# Patient Record
Sex: Female | Born: 1995 | Race: Black or African American | Hispanic: No | Marital: Single | Smoking: Never smoker
Health system: Southern US, Community
[De-identification: ages and names within clinical notes are randomized; demographics above are authoritative.]

## PROBLEM LIST (undated history)

## (undated) ENCOUNTER — Inpatient Hospital Stay (HOSPITAL_COMMUNITY): Payer: Self-pay

## (undated) DIAGNOSIS — N760 Acute vaginitis: Secondary | ICD-10-CM

## (undated) DIAGNOSIS — K219 Gastro-esophageal reflux disease without esophagitis: Secondary | ICD-10-CM

## (undated) DIAGNOSIS — B9689 Other specified bacterial agents as the cause of diseases classified elsewhere: Secondary | ICD-10-CM

## (undated) DIAGNOSIS — N390 Urinary tract infection, site not specified: Secondary | ICD-10-CM

## (undated) HISTORY — PX: NO PAST SURGERIES: SHX2092

---

## 2015-03-02 ENCOUNTER — Ambulatory Visit (INDEPENDENT_AMBULATORY_CARE_PROVIDER_SITE_OTHER): Payer: Managed Care, Other (non HMO) | Admitting: Family Medicine

## 2015-03-02 VITALS — BP 90/64 | HR 83 | Temp 98.3°F | Resp 16 | Ht 63.0 in | Wt 104.0 lb

## 2015-03-02 DIAGNOSIS — N760 Acute vaginitis: Secondary | ICD-10-CM

## 2015-03-02 DIAGNOSIS — B3731 Acute candidiasis of vulva and vagina: Secondary | ICD-10-CM

## 2015-03-02 DIAGNOSIS — B373 Candidiasis of vulva and vagina: Secondary | ICD-10-CM | POA: Diagnosis not present

## 2015-03-02 DIAGNOSIS — A499 Bacterial infection, unspecified: Secondary | ICD-10-CM | POA: Diagnosis not present

## 2015-03-02 DIAGNOSIS — N898 Other specified noninflammatory disorders of vagina: Secondary | ICD-10-CM | POA: Diagnosis not present

## 2015-03-02 DIAGNOSIS — B9689 Other specified bacterial agents as the cause of diseases classified elsewhere: Secondary | ICD-10-CM

## 2015-03-02 LAB — POCT WET PREP WITH KOH
KOH Prep POC: POSITIVE
Trichomonas, UA: NEGATIVE

## 2015-03-02 MED ORDER — FLUCONAZOLE 150 MG PO TABS: 150.0000 mg | ORAL_TABLET | Freq: Once | ORAL | Status: DC

## 2015-03-02 MED ORDER — METRONIDAZOLE 500 MG PO TABS: 500.0000 mg | ORAL_TABLET | Freq: Three times a day (TID) | ORAL | Status: DC

## 2015-03-02 NOTE — Progress Notes (Signed)
Vaginitis Subjective:  Patient ID: Laurie Castro, female    DOB: 1995-08-24  Age: 19 y.o. MRN: [id number]  Patient is here with history of a vaginal discharge and odor and irritation. She does not have any pain. She has been sexually involved in the past, but it is been a couple of months. She was STD testing a month ago and she had BV a month ago. She does not feel the need for repeat STD testing since she has not had sex since then. She was treated with a cream the last time.   Objective:   No acute distress. Normal external genitalia. Creamy discharge is visible at the opening. Vaginal mucosa unremarkable except for thick creamy discharge. Wet prep taken. Bimanual exam feels no adnexal or uterine masses.  Assessment & Plan:   Assessment:  Vaginal discharge Vaginitis/vaginosis  Plan:  Wet prep. STI testing not done today  Results for orders placed or performed in visit on 03/02/15  POCT Wet Prep with KOH  Result Value Ref Range   Trichomonas, UA Negative    Clue Cells Wet Prep HPF POC Moderate    Epithelial Wet Prep HPF POC Many Few, Moderate, Many   Yeast Wet Prep HPF POC Positve    Bacteria Wet Prep HPF POC Many (A) None, Few   RBC Wet Prep HPF POC Moderate    WBC Wet Prep HPF POC Many    KOH Prep POC Positive      There are no Patient Instructions on file for this visit.   Mosella Kasa, MD 03/02/2015

## 2015-03-02 NOTE — Patient Instructions (Signed)
Take fluconazole one pill initially today and repeat in 3 or 4 days if still having any significant discharge or itching or irritation  Take the metronidazole one pill twice daily for bacterial vaginosis. Do not drink alcohol while on this.  Return if further problems or concerns  Vaginitis Vaginitis is an inflammation of the vagina. It is most often caused by a change in the normal balance of the bacteria and yeast that live in the vagina. This change in balance causes an overgrowth of certain bacteria or yeast, which causes the inflammation. There are different types of vaginitis, but the most common types are:  Bacterial vaginosis.  Yeast infection (candidiasis).  Trichomoniasis vaginitis. This is a sexually transmitted infection (STI).  Viral vaginitis.  Atropic vaginitis.  Allergic vaginitis. CAUSES  The cause depends on the type of vaginitis. Vaginitis can be caused by:  Bacteria (bacterial vaginosis).  Yeast (yeast infection).  A parasite (trichomoniasis vaginitis)  A virus (viral vaginitis).  Low hormone levels (atrophic vaginitis). Low hormone levels can occur during pregnancy, breastfeeding, or after menopause.  Irritants, such as bubble baths, scented tampons, and feminine sprays (allergic vaginitis). Other factors can change the normal balance of the yeast and bacteria that live in the vagina. These include:  Antibiotic medicines.  Poor hygiene.  Diaphragms, vaginal sponges, spermicides, birth control pills, and intrauterine devices (IUD).  Sexual intercourse.  Infection.  Uncontrolled diabetes.  A weakened immune system. SYMPTOMS  Symptoms can vary depending on the cause of the vaginitis. Common symptoms include:  Abnormal vaginal discharge.  The discharge is white, gray, or yellow with bacterial vaginosis.  The discharge is thick, white, and cheesy with a yeast infection.  The discharge is frothy and yellow or greenish with trichomoniasis.  A  bad vaginal odor.  The odor is fishy with bacterial vaginosis.  Vaginal itching, pain, or swelling.  Painful intercourse.  Pain or burning when urinating. Sometimes, there are no symptoms. TREATMENT  Treatment will vary depending on the type of infection.   Bacterial vaginosis and trichomoniasis are often treated with antibiotic creams or pills.  Yeast infections are often treated with antifungal medicines, such as vaginal creams or suppositories.  Viral vaginitis has no cure, but symptoms can be treated with medicines that relieve discomfort. Your sexual partner should be treated as well.  Atrophic vaginitis may be treated with an estrogen cream, pill, suppository, or vaginal ring. If vaginal dryness occurs, lubricants and moisturizing creams may help. You may be told to avoid scented soaps, sprays, or douches.  Allergic vaginitis treatment involves quitting the use of the product that is causing the problem. Vaginal creams can be used to treat the symptoms. HOME CARE INSTRUCTIONS   Take all medicines as directed by your caregiver.  Keep your genital area clean and dry. Avoid soap and only rinse the area with water.  Avoid douching. It can remove the healthy bacteria in the vagina.  Do not use tampons or have sexual intercourse until your vaginitis has been treated. Use sanitary pads while you have vaginitis.  Wipe from front to back. This avoids the spread of bacteria from the rectum to the vagina.  Let air reach your genital area.  Wear cotton underwear to decrease moisture buildup.  Avoid wearing underwear while you sleep until your vaginitis is gone.  Avoid tight pants and underwear or nylons without a cotton panel.  Take off wet clothing (especially bathing suits) as soon as possible.  Use mild, non-scented products. Avoid using irritants, such  as:  Scented feminine sprays.  Fabric softeners.  Scented detergents.  Scented tampons.  Scented soaps or bubble  baths.  Practice safe sex and use condoms. Condoms may prevent the spread of trichomoniasis and viral vaginitis. SEEK MEDICAL CARE IF:   You have abdominal pain.  You have a fever or persistent symptoms for more than 2-3 days.  You have a fever and your symptoms suddenly get worse. Document Released: 03/31/2007 Document Revised: 02/26/2012 Document Reviewed: 11/14/2011 [facility] Patient Information 2015 [place], [place]. This information is not intended to replace advice given to you by your health care provider. Make sure you discuss any questions you have with your health care provider.  Bacterial Vaginosis Bacterial vaginosis is a vaginal infection that occurs when the normal balance of bacteria in the vagina is disrupted. It results from an overgrowth of certain bacteria. This is the most common vaginal infection in women of childbearing age. Treatment is important to prevent complications, especially in pregnant women, as it can cause a premature delivery. CAUSES  Bacterial vaginosis is caused by an increase in harmful bacteria that are normally present in smaller amounts in the vagina. Several different kinds of bacteria can cause bacterial vaginosis. However, the reason that the condition develops is not fully understood. RISK FACTORS Certain activities or behaviors can put you at an increased risk of developing bacterial vaginosis, including:  Having a new sex partner or multiple sex partners.  Douching.  Using an intrauterine device (IUD) for contraception. Women do not get bacterial vaginosis from toilet seats, bedding, swimming pools, or contact with objects around them. SIGNS AND SYMPTOMS  Some women with bacterial vaginosis have no signs or symptoms. Common symptoms include:  Grey vaginal discharge.  A fishlike odor with discharge, especially after sexual intercourse.  Itching or burning of the vagina and vulva.  Burning or pain with urination. DIAGNOSIS  Your health  care provider will take a medical history and examine the vagina for signs of bacterial vaginosis. A sample of vaginal fluid may be taken. Your health care provider will look at this sample under a microscope to check for bacteria and abnormal cells. A vaginal pH test may also be done.  TREATMENT  Bacterial vaginosis may be treated with antibiotic medicines. These may be given in the form of a pill or a vaginal cream. A second round of antibiotics may be prescribed if the condition comes back after treatment.  HOME CARE INSTRUCTIONS   Only take over-the-counter or prescription medicines as directed by your health care provider.  If antibiotic medicine was prescribed, take it as directed. Make sure you finish it even if you start to feel better.  Do not have sex until treatment is completed.  Tell all sexual partners that you have a vaginal infection. They should see their health care provider and be treated if they have problems, such as a mild rash or itching.  Practice safe sex by using condoms and only having one sex partner. SEEK MEDICAL CARE IF:   Your symptoms are not improving after 3 days of treatment.  You have increased discharge or pain.  You have a fever. MAKE SURE YOU:   Understand these instructions.  Will watch your condition.  Will get help right away if you are not doing well or get worse. FOR MORE INFORMATION  Centers for Disease Control and Prevention, Division of STD Prevention: [employer] American Sexual Health Association (ASHA): www.ashastd.org  Document Released: 06/03/2005 Document Revised: 03/24/2013 Document Reviewed: 01/13/2013 ExitCare Patient Information  2015 [employer]. This information is not intended to replace advice given to you by your health care provider. Make sure you discuss any questions you have with your health care provider.

## 2015-07-08 ENCOUNTER — Ambulatory Visit (INDEPENDENT_AMBULATORY_CARE_PROVIDER_SITE_OTHER): Payer: Managed Care, Other (non HMO) | Admitting: Physician Assistant

## 2015-07-08 VITALS — BP 110/70 | HR 74 | Temp 98.2°F | Resp 18 | Ht 61.25 in | Wt 110.2 lb

## 2015-07-08 DIAGNOSIS — B373 Candidiasis of vulva and vagina: Secondary | ICD-10-CM

## 2015-07-08 DIAGNOSIS — N898 Other specified noninflammatory disorders of vagina: Secondary | ICD-10-CM | POA: Diagnosis not present

## 2015-07-08 DIAGNOSIS — B3731 Acute candidiasis of vulva and vagina: Secondary | ICD-10-CM

## 2015-07-08 DIAGNOSIS — L309 Dermatitis, unspecified: Secondary | ICD-10-CM | POA: Diagnosis not present

## 2015-07-08 LAB — POCT WET + KOH PREP: TRICH BY WET PREP: ABSENT

## 2015-07-08 LAB — POCT URINE PREGNANCY: PREG TEST UR: NEGATIVE

## 2015-07-08 MED ORDER — FLUCONAZOLE 150 MG PO TABS: 150.0000 mg | ORAL_TABLET | Freq: Once | ORAL | Status: DC

## 2015-07-08 MED ORDER — BETAMETHASONE DIPROPIONATE AUG 0.05 % EX LOTN: TOPICAL_LOTION | Freq: Two times a day (BID) | CUTANEOUS | Status: DC

## 2015-07-08 NOTE — Patient Instructions (Signed)
I will contact you with your lab results as soon as they are available.   If you have not heard from me in 2 weeks, please contact me.  The fastest way to get your results is to register for My Chart (see the instructions on the last page of this printout).   

## 2015-07-08 NOTE — Progress Notes (Signed)
   Laurie Castro  MRN: [id number] DOB: 03/28/1996  Subjective:  Pt presents to clinic with 2 concerns 1- rash on her right hand - she has h/o eczema - the rash has been there for a while and it seems to come and go 2- vaginal discharge for the last 2 days - it is yellow/green color - no itching or irritation - no urinary symptoms.  She is sexually active - with a new partner about 2 weeks - has used condoms sometimes.  Patient Active Problem List   Diagnosis Date Noted  . Eczema 07/08/2015    Current Outpatient Prescriptions on File Prior to Visit  Medication Sig Dispense Refill  . Norgestimate-Ethinyl Estradiol Triphasic 0.18/0.215/0.25 MG-35 MCG tablet Take 1 tablet by mouth daily.     No current facility-administered medications on file prior to visit.    No Known Allergies  Review of Systems  Constitutional: Negative for fever and chills.  Genitourinary: Positive for vaginal discharge. Negative for dysuria, urgency and frequency.   Objective:  BP 110/70 mmHg  Pulse 74  Temp(Src) 98.2 F (36.8 C) (Oral)  Resp 18  Ht 5' 1.25" (1.556 m)  Wt 110 lb 4 oz (50.009 kg)  BMI 20.66 kg/m2  SpO2 97%  LMP 06/05/2015 (Exact Date)  Physical Exam  Constitutional: She is oriented to person, place, and time and well-developed, well-nourished, and in no distress.  HENT:  Head: Normocephalic and atraumatic.  Right Ear: Hearing and external ear normal.  Left Ear: Hearing and external ear normal.  Eyes: Conjunctivae are normal.  Neck: Normal range of motion.  Pulmonary/Chest: Effort normal.  Neurological: She is alert and oriented to person, place, and time. Gait normal.  Skin: Skin is warm and dry.  2in patch of dry skin on right dorsum of hand without erythema or central clearing  Psychiatric: Mood, memory, affect and judgment normal.  Vitals reviewed.  Results for orders placed or performed in visit on 07/08/15  POCT Wet + KOH Prep  Result Value Ref Range   Yeast by KOH  Present Present, Absent   Yeast by wet prep Present Present, Absent   WBC by wet prep Moderate (A) None, Few, Too numerous to count   Clue Cells Wet Prep HPF POC None None, Too numerous to count   Trich by wet prep Absent Present, Absent   Bacteria Wet Prep HPF POC Many (A) None, Few, Too numerous to count   Epithelial Cells By [employer] ([facility]) Many (A) None, Few, Too numerous to count   RBC,UR,HPF,POC None None RBC/hpf  POCT urine pregnancy  Result Value Ref Range   Preg Test, Ur Negative Negative    Assessment and Plan :  Eczema - Plan: betamethasone, augmented, (DIPROLENE) 0.05 % lotion  Vaginal discharge - Plan: POCT Wet + KOH Prep, POCT urine pregnancy, GC/Chlamydia Probe Amp - pt started her OCP in mid cycle so her LMP was 12/16 but she just started on her placebo week - she feels like she is getting ready to start her menses - she will f/u if she does not start - we will wait for uriprobe to return for treatment if indicated  Yeast vaginitis - Plan: fluconazole (DIFLUCAN) 150 MG tablet, Care order/instruction  Benny Lennert PA-C  [facility] 07/08/2015 4:29 PM

## 2015-07-10 LAB — GC/CHLAMYDIA PROBE AMP
CT Probe RNA: NOT DETECTED
GC Probe RNA: NOT DETECTED

## 2016-08-07 ENCOUNTER — Encounter: Payer: Self-pay | Admitting: Family Medicine

## 2016-08-07 ENCOUNTER — Ambulatory Visit (INDEPENDENT_AMBULATORY_CARE_PROVIDER_SITE_OTHER): Payer: 59 | Admitting: Family Medicine

## 2016-08-07 VITALS — BP 105/65 | HR 93 | Temp 98.8°F | Resp 18 | Ht 61.25 in | Wt 104.4 lb

## 2016-08-07 DIAGNOSIS — Z23 Encounter for immunization: Secondary | ICD-10-CM | POA: Diagnosis not present

## 2016-08-07 DIAGNOSIS — Z113 Encounter for screening for infections with a predominantly sexual mode of transmission: Secondary | ICD-10-CM | POA: Diagnosis not present

## 2016-08-07 DIAGNOSIS — A499 Bacterial infection, unspecified: Secondary | ICD-10-CM | POA: Diagnosis not present

## 2016-08-07 DIAGNOSIS — R350 Frequency of micturition: Secondary | ICD-10-CM | POA: Diagnosis not present

## 2016-08-07 DIAGNOSIS — N898 Other specified noninflammatory disorders of vagina: Secondary | ICD-10-CM

## 2016-08-07 LAB — POCT URINALYSIS DIP (MANUAL ENTRY)
BILIRUBIN UA: NEGATIVE
BILIRUBIN UA: NEGATIVE
GLUCOSE UA: NEGATIVE
Leukocytes, UA: NEGATIVE
Nitrite, UA: NEGATIVE
PH UA: 7.5
Protein Ur, POC: NEGATIVE
Spec Grav, UA: 1.015
Urobilinogen, UA: 0.2

## 2016-08-07 MED ORDER — METRONIDAZOLE 0.75 % VA GEL: 1.0000 | Freq: Two times a day (BID) | VAGINAL | 1 refills | Status: DC

## 2016-08-07 MED ORDER — BORIC ACID TOPICAL POWD: 0 refills | Status: DC

## 2016-08-07 NOTE — Patient Instructions (Addendum)
After completing your next period bleed start the Metrogel vaginal suppository twice a day for one week.   Then use boric acid. Boric acid-A course of boric acid insert per vagina daily for one week then twice a week for a second week After you finish boric acid do another course of metrogel after your next period.  Return to clinic in 6 weeks   IF you received an x-ray today, you will receive an invoice from [facility]. Please contact [facility] at [phone] with questions or concerns regarding your invoice.   IF you received labwork today, you will receive an invoice from [facility]. Please contact [facility] at [phone] with questions or concerns regarding your invoice.   Our billing staff will not be able to assist you with questions regarding bills from these companies.  You will be contacted with the lab results as soon as they are available. The fastest way to get your results is to activate your My Chart account. Instructions are located on the last page of this paperwork. If you have not heard from us regarding the results in 2 weeks, please contact this office.

## 2016-08-07 NOTE — Progress Notes (Signed)
Chief Complaint  Patient presents with  . Follow-up    bladder infection.     HPI   Pt reports that she has been getting frequent UTIs She went to the hospital and was told that she had a bladder infection Since being treated for cystitis she has been having BV back to back  She reports that she typically gets vaginitis due to BV every month around her period. She reports that sometimes she gets the UTI instead of BV or vice versa  She has also been treated for yeast  She reports that her period started early this morning  For the past two days she has a fishy odor and discharge Patient's last menstrual period was 08/06/2016.  She is sexually active and her last intercourse was 4 days ago.   History reviewed. No pertinent past medical history.  Current Outpatient Prescriptions  Medication Sig Dispense Refill  . betamethasone, augmented, (DIPROLENE) 0.05 % lotion Apply topically 2 (two) times daily. 30 mL 0  . Boric Acid Topical POWD Insert 600mg  capsule suppository per vagina daily for one week followed by twice a week until the next period. 1 Bottle 0  . fluconazole (DIFLUCAN) 150 MG tablet Take 1 tablet (150 mg total) by mouth once. Repeat in 1 week is needed (Patient not taking: Reported on 08/07/2016) 2 tablet 0  . metroNIDAZOLE (METROGEL VAGINAL) 0.75 % vaginal gel Place 1 Applicatorful vaginally 2 (two) times daily. 70 g 1  . Norgestimate-Ethinyl Estradiol Triphasic 0.18/0.215/0.25 MG-35 MCG tablet Take 1 tablet by mouth daily.     No current facility-administered medications for this visit.     Allergies: No Known Allergies  History reviewed. No pertinent surgical history.  Social History   Social History  . Marital status: Single    Spouse name: N/A  . Number of children: N/A  . Years of education: N/A   Social History Main Topics  . Smoking status: Never Smoker  . Smokeless tobacco: Never Used  . Alcohol use None  . Drug use: Unknown  . Sexual activity:  Not Asked   Other Topics Concern  . None   Social History Narrative  . None    ROS  Objective: Vitals:   08/07/16 1618  BP: 105/65  Pulse: 93  Resp: 18  Temp: 98.8 F (37.1 C)  TempSrc: Oral  SpO2: 100%  Weight: 104 lb 6.4 oz (47.4 kg)  Height: 5' 1.25" (1.556 m)    Physical Exam  Constitutional: She is oriented to person, place, and time. She appears well-developed and well-nourished.  HENT:  Head: Normocephalic and atraumatic.  Cardiovascular: Normal rate, regular rhythm and normal heart sounds.   Pulmonary/Chest: Effort normal and breath sounds normal. No respiratory distress. She has no wheezes.  Abdominal: Soft. Bowel sounds are normal.  No flank pain or suprapubic tenderness  Neurological: She is alert and oriented to person, place, and time.    Assessment and Plan Dre Laurie Castro was seen today for follow-up.  Diagnoses and all orders for this visit:  Need for prophylactic vaccination and inoculation against influenza -     Flu Vaccine QUAD 36+ mos IM  Urinary frequency- no uti -     POCT urinalysis dipstick  Vaginal discharge- disucssed that if she continues to have vaginal discharge with fishy odor to use boric acid after completing the metrogel Screened for stds today -     GC/Chlamydia Probe Amp -     HIV antibody -     Boric  Acid Topical POWD; Insert 600mg  capsule suppository per vagina daily for one week followed by twice a week until the next period. -     metroNIDAZOLE (METROGEL VAGINAL) 0.75 % vaginal gel; Place 1 Applicatorful vaginally 2 (two) times daily.  Recurrent bacterial infection -     HIV antibody  Screen for STD (sexually transmitted disease) -     HIV antibody  Other orders      Laurie Castro A Laurie Castro

## 2016-08-08 LAB — GC/CHLAMYDIA PROBE AMP
CHLAMYDIA, DNA PROBE: NEGATIVE
Neisseria gonorrhoeae by PCR: NEGATIVE

## 2016-08-08 LAB — HIV ANTIBODY (ROUTINE TESTING W REFLEX): HIV SCREEN 4TH GENERATION: NONREACTIVE

## 2016-08-16 ENCOUNTER — Telehealth: Payer: Self-pay | Admitting: Family Medicine

## 2016-08-16 NOTE — Telephone Encounter (Signed)
I called patient back and she prefers to speak with you regarding her medication administration question.

## 2016-08-16 NOTE — Telephone Encounter (Signed)
Pt is needing to talk to someone about how take her medication   Best number [phone]

## 2016-08-16 NOTE — Telephone Encounter (Signed)
Notify the patient that I am out of the office [facility]

## 2016-08-16 NOTE — Telephone Encounter (Signed)
Pt is returning call from [facility], but would like to talk to Dr Creta LevinStallings if possible. Will keep her phone on her.

## 2016-08-16 NOTE — Telephone Encounter (Signed)
Left voice mail

## 2016-08-17 NOTE — Telephone Encounter (Signed)
Pt wanted to clarify metrogel and boric acid directions. I read them from dr Creta Levinstallings plan Verbalized understanding

## 2016-09-03 ENCOUNTER — Telehealth: Payer: Self-pay | Admitting: Family Medicine

## 2016-09-03 NOTE — Telephone Encounter (Signed)
PATIENT CALLED TO GET MORE CLARIFICATION ON TAKING THE BORIC ACID AND METROGEL DIRECTIONS FROM DR. STALLINGS. BEST PHONE [phone] (CELL) MBC

## 2016-09-04 NOTE — Telephone Encounter (Signed)
Used metrogel then boric acid  Still has sx's of BV and now a discharge is white and fishy  Is that ok?  Going to do next round after period.

## 2016-09-04 NOTE — Telephone Encounter (Signed)
After she completes the second round she should return if she still has odor.

## 2016-09-06 NOTE — Telephone Encounter (Signed)
Pt advised.

## 2016-10-04 ENCOUNTER — Ambulatory Visit: Payer: 59

## 2016-10-04 ENCOUNTER — Ambulatory Visit (INDEPENDENT_AMBULATORY_CARE_PROVIDER_SITE_OTHER): Payer: 59 | Admitting: Family Medicine

## 2016-10-04 VITALS — BP 110/74 | HR 82 | Temp 98.7°F | Resp 18 | Ht 61.25 in | Wt 106.4 lb

## 2016-10-04 DIAGNOSIS — N898 Other specified noninflammatory disorders of vagina: Secondary | ICD-10-CM

## 2016-10-04 DIAGNOSIS — B3731 Acute candidiasis of vulva and vagina: Secondary | ICD-10-CM

## 2016-10-04 DIAGNOSIS — B373 Candidiasis of vulva and vagina: Secondary | ICD-10-CM | POA: Diagnosis not present

## 2016-10-04 MED ORDER — METRONIDAZOLE 500 MG PO TABS: 500.0000 mg | ORAL_TABLET | Freq: Two times a day (BID) | ORAL | 0 refills | Status: AC

## 2016-10-04 MED ORDER — FLUCONAZOLE 150 MG PO TABS: 150.0000 mg | ORAL_TABLET | Freq: Once | ORAL | 0 refills | Status: AC

## 2016-10-04 MED ORDER — ONDANSETRON HCL 4 MG PO TABS: 4.0000 mg | ORAL_TABLET | Freq: Three times a day (TID) | ORAL | 0 refills | Status: DC | PRN

## 2016-10-04 NOTE — Progress Notes (Signed)
Chief Complaint  Patient presents with  . Follow-up    BV and pt states it has returned     HPI Pt reports that she has a thick white discharge She is concerned that the BV has returned Patient's last menstrual period was 10/04/2016. She reports that she is not always using condoms She is consistently sexually active with the same partner She does not use probiotics States that her vaginal discharge started after having recurrent UTIs Since hre last visit 08/07/2016 she has done flagyl followed by boric acid, followed by flagyl She denies dysparunea She is currently menstruating but has note a fishy odor  No past medical history on file.  Current Outpatient Prescriptions  Medication Sig Dispense Refill  . betamethasone, augmented, (DIPROLENE) 0.05 % lotion Apply topically 2 (two) times daily. (Patient not taking: Reported on 10/04/2016) 30 mL 0  . Boric Acid Topical POWD Insert  capsule suppository per vagina daily for one week followed by twice a week until the next period. (Patient not taking: Reported on 10/04/2016) 1 Bottle 0  . metroNIDAZOLE (FLAGYL) 500 MG tablet Take 1 tablet (500 mg total) by mouth 2 (two) times daily. 21 tablet 0  . metroNIDAZOLE (METROGEL VAGINAL) 0.75 % vaginal gel Place 1 Applicatorful vaginally 2 (two) times daily. (Patient not taking: Reported on 10/04/2016) 70 g 1  . Norgestimate-Ethinyl Estradiol Triphasic 0.18/0.215/0.25 MG-35 MCG tablet Take 1 tablet by mouth daily.    . ondansetron (ZOFRAN) 4 MG tablet Take 1 tablet (4 mg total) by mouth every 8 (eight) hours as needed for nausea or vomiting. 20 tablet 0   No current facility-administered medications for this visit.     Allergies: No Known Allergies  No past surgical history on file.  Social History   Social History  . Marital status: Single    Spouse name: N/A  . Number of children: N/A  . Years of education: N/A   Social History Main Topics  . Smoking status: Never Smoker  .  Smokeless tobacco: Never Used  . Alcohol use None  . Drug use: Unknown  . Sexual activity: Not Asked   Other Topics Concern  . None   Social History Narrative  . None    Review of Systems  Constitutional: Negative for chills and fever.  Respiratory: Negative for cough and wheezing.   Cardiovascular: Negative for chest pain and palpitations.  Gastrointestinal: Negative for abdominal pain, nausea and vomiting.  Genitourinary: Negative for dysuria, frequency and urgency.    Objective: Vitals:   10/04/16 1719  BP: 110/74  Pulse: 82  Resp: 18  Temp: 98.7 F (37.1 C)  TempSrc: Oral  SpO2: 98%  Weight: 106 lb 6.4 oz (48.3 kg)  Height: 5' 1.25" (1.556 m)    Physical Exam  Chaperone present Vaginal exam Labia normal bilaterally without skin lesions Urethral meatus normal appearing without erythema Vagina with blood noted, unable to visualize discharge discharge No CMT, ovaries small and not palpable Uterus midline, nontender  Assessment and Plan Dre'Yon was seen today for follow-up.  Diagnoses and all orders for this visit:  Vaginal discharge- cancelled wet prep since pt is bleeding In the past pt was on metronidazole gel but will do pills this time and plan to treat for yeast after with nausea meds -  Advised probiotic -     Cancel: POCT Wet + KOH Prep  Yeast vaginitis- empiric treatment advised -     fluconazole (DIFLUCAN) 150 MG tablet; Take 1 tablet (150 mg total)  by mouth once. Repeat in 3 days  Other orders -     metroNIDAZOLE (FLAGYL) 500 MG tablet; Take 1 tablet (500 mg total) by mouth 2 (two) times daily. -     ondansetron (ZOFRAN) 4 MG tablet; Take 1 tablet (4 mg total) by mouth every 8 (eight) hours as needed for nausea or vomiting.     Lynnox Girten A Marquin Patino

## 2016-10-04 NOTE — Patient Instructions (Addendum)
Align probiotic after you finish the antibiotics    IF you received an x-ray today, you will receive an invoice from [facility]. Please contact [facility] at [phone] with questions or concerns regarding your invoice.   IF you received labwork today, you will receive an invoice from [facility]. Please contact [facility] at [phone] with questions or concerns regarding your invoice.   Our billing staff will not be able to assist you with questions regarding bills from these companies.  You will be contacted with the lab results as soon as they are available. The fastest way to get your results is to activate your My Chart account. Instructions are located on the last page of this paperwork. If you have not heard from [place] regarding the results in 2 weeks, please contact this office.

## 2016-11-01 ENCOUNTER — Telehealth: Payer: Self-pay | Admitting: Family Medicine

## 2016-11-01 NOTE — Telephone Encounter (Signed)
Pt is needing to talk with dr Creta Levinstallings about the bv symptoms that are still occurring right before her period  Best number [phone]

## 2016-11-02 NOTE — Telephone Encounter (Signed)
BV symptoms come every time before period   Was treated with pill and boric acid.  Wants to find a better treatment /plan  She is short money and cant afford a office visit

## 2016-11-05 ENCOUNTER — Ambulatory Visit (INDEPENDENT_AMBULATORY_CARE_PROVIDER_SITE_OTHER): Payer: 59 | Admitting: Family Medicine

## 2016-11-05 ENCOUNTER — Encounter: Payer: Self-pay | Admitting: Family Medicine

## 2016-11-05 VITALS — BP 99/64 | HR 77 | Temp 98.3°F | Resp 16 | Ht 61.5 in | Wt 101.3 lb

## 2016-11-05 DIAGNOSIS — N898 Other specified noninflammatory disorders of vagina: Secondary | ICD-10-CM

## 2016-11-05 LAB — POCT WET + KOH PREP
TRICH BY WET PREP: ABSENT
YEAST BY WET PREP: ABSENT
Yeast by KOH: ABSENT

## 2016-11-05 MED ORDER — METRONIDAZOLE 0.75 % VA GEL: 1.0000 | Freq: Two times a day (BID) | VAGINAL | 0 refills | Status: DC

## 2016-11-05 NOTE — Patient Instructions (Addendum)
   IF you received an x-ray today, you will receive an invoice from [facility]. Please contact [facility] at [phone] with questions or concerns regarding your invoice.   IF you received labwork today, you will receive an invoice from [facility]. Please contact [facility] at [phone] with questions or concerns regarding your invoice.   Our billing staff will not be able to assist you with questions regarding bills from these companies.  You will be contacted with the lab results as soon as they are available. The fastest way to get your results is to activate your My Chart account. Instructions are located on the last page of this paperwork. If you have not heard from us regarding the results in 2 weeks, please contact this office.     Bacterial Vaginosis Bacterial vaginosis is a vaginal infection that occurs when the normal balance of bacteria in the vagina is disrupted. It results from an overgrowth of certain bacteria. This is the most common vaginal infection among women ages 15-44. Because bacterial vaginosis increases your risk for STIs (sexually transmitted infections), getting treated can help reduce your risk for chlamydia, gonorrhea, herpes, and HIV (human immunodeficiency virus). Treatment is also important for preventing complications in pregnant women, because this condition can cause an early (premature) delivery. What are the causes? This condition is caused by an increase in harmful bacteria that are normally present in small amounts in the vagina. However, the reason that the condition develops is not fully understood. What increases the risk? The following factors may make you more likely to develop this condition:  Having a new sexual partner or multiple sexual partners.  Having unprotected sex.  Douching.  Having an intrauterine device (IUD).  Smoking.  Drug and alcohol abuse.  Taking certain antibiotic medicines.  Being  pregnant.  You cannot get bacterial vaginosis from toilet seats, bedding, swimming pools, or contact with objects around you. What are the signs or symptoms? Symptoms of this condition include:  Grey or white vaginal discharge. The discharge can also be watery or foamy.  A fish-like odor with discharge, especially after sexual intercourse or during menstruation.  Itching in and around the vagina.  Burning or pain with urination.  Some women with bacterial vaginosis have no signs or symptoms. How is this diagnosed? This condition is diagnosed based on:  Your medical history.  A physical exam of the vagina.  Testing a sample of vaginal fluid under a microscope to look for a large amount of bad bacteria or abnormal cells. Your health care provider may use a cotton swab or a small wooden spatula to collect the sample.  How is this treated? This condition is treated with antibiotics. These may be given as a pill, a vaginal cream, or a medicine that is put into the vagina (suppository). If the condition comes back after treatment, a second round of antibiotics may be needed. Follow these instructions at home: Medicines  Take over-the-counter and prescription medicines only as told by your health care provider.  Take or use your antibiotic as told by your health care provider. Do not stop taking or using the antibiotic even if you start to feel better. General instructions  If you have a female sexual partner, tell her that you have a vaginal infection. She should see her health care provider and be treated if she has symptoms. If you have a female sexual partner, he does not need treatment.  During treatment: ? Avoid sexual activity until you finish treatment. ?   Do not douche. ? Avoid alcohol as directed by your health care provider. ? Avoid breastfeeding as directed by your health care provider.  Drink enough water and fluids to keep your urine clear or pale yellow.  Keep the  area around your vagina and rectum clean. ? Wash the area daily with warm water. ? Wipe yourself from front to back after using the toilet.  Keep all follow-up visits as told by your health care provider. This is important. How is this prevented?  Do not douche.  Wash the outside of your vagina with warm water only.  Use protection when having sex. This includes latex condoms and dental dams.  Limit how many sexual partners you have. To help prevent bacterial vaginosis, it is best to have sex with just one partner (monogamous).  Make sure you and your sexual partner are tested for STIs.  Wear cotton or cotton-lined underwear.  Avoid wearing tight pants and pantyhose, especially during summer.  Limit the amount of alcohol that you drink.  Do not use any products that contain nicotine or tobacco, such as cigarettes and e-cigarettes. If you need help quitting, ask your health care provider.  Do not use illegal drugs. Where to find more information:  Centers for Disease Control and Prevention: www.cdc.gov/std  American Sexual Health Association (ASHA): www.ashastd.org  U.S. Department of Health and Human Services, Office on Women's Health: www.womenshealth.gov/ or https://www.womenshealth.gov/a-z-topics/bacterial-vaginosis Contact a health care provider if:  Your symptoms do not improve, even after treatment.  You have more discharge or pain when urinating.  You have a fever.  You have pain in your abdomen.  You have pain during sex.  You have vaginal bleeding between periods. Summary  Bacterial vaginosis is a vaginal infection that occurs when the normal balance of bacteria in the vagina is disrupted.  Because bacterial vaginosis increases your risk for STIs (sexually transmitted infections), getting treated can help reduce your risk for chlamydia, gonorrhea, herpes, and HIV (human immunodeficiency virus). Treatment is also important for preventing complications in  pregnant women, because the condition can cause an early (premature) delivery.  This condition is treated with antibiotic medicines. These may be given as a pill, a vaginal cream, or a medicine that is put into the vagina (suppository). This information is not intended to replace advice given to you by your health care provider. Make sure you discuss any questions you have with your health care provider. Document Released: 06/03/2005 Document Revised: 02/17/2016 Document Reviewed: 02/17/2016 Elsevier Interactive Patient Education  2017 Elsevier Inc.  

## 2016-11-05 NOTE — Progress Notes (Signed)
  Chief Complaint  Patient presents with  . Follow-up    HPI  Pt reports that she has another discharge right before her period She states that for the past few days her symptoms have decreased She was doing apple cider vinegar baths which seemed to help with her odor Patient's last menstrual period was 10/31/2016.  No past medical history on file.  Current Outpatient Prescriptions  Medication Sig Dispense Refill  . famotidine (PEPCID) 20 MG tablet Take 1 tablet (20 mg total) by mouth 2 (two) times daily. Before meals 60 tablet 1  . metroNIDAZOLE (METROGEL) 0.75 % vaginal gel After you complete the oral flagyl, then start the metrogel, 1 applicator 2 x a week at bedtime for 6 months. 70 g 8   No current facility-administered medications for this visit.     Allergies: No Known Allergies  No past surgical history on file.  Social History   Social History  . Marital status: Single    Spouse name: N/A  . Number of children: N/A  . Years of education: N/A   Social History Main Topics  . Smoking status: Never Smoker  . Smokeless tobacco: Never Used  . Alcohol use 0.6 oz/week    1 Standard drinks or equivalent per week  . Drug use: No  . Sexual activity: Yes    Partners: Male    Birth control/ protection: None   Other Topics Concern  . None   Social History Narrative  . None    ROS See hpi  Objective: Vitals:   11/05/16 1346  BP: 99/64  Pulse: 77  Resp: 16  Temp: 98.3 F (36.8 C)  TempSrc: Oral  SpO2: 100%  Weight: 101 lb 4.8 oz (45.9 kg)  Height: 5' 1.5" (1.562 m)    Physical Exam  Constitutional: She appears well-developed and well-nourished.   Vaginal exam Labia normal bilaterally without skin lesions Urethral meatus normal appearing without erythema Vagina with white thick discharge  Moderate amount of clue cells  No yeast   Assessment and Plan Laurie Castro was seen today for follow-up.  Diagnoses and all orders for this visit:  Vaginal  discharge- advised metrogel for BV -     POCT Wet + KOH Prep       Eulas Schweitzer A Ramonia Mcclaran

## 2016-11-06 NOTE — Telephone Encounter (Signed)
discussed that if the Metrogel does not work will use Clindamycin on the next episode. Continued use of probiotic and skin care precautions as well as condom use discussed.

## 2016-11-19 ENCOUNTER — Encounter: Payer: Self-pay | Admitting: Family Medicine

## 2016-11-20 MED ORDER — CLOTRIMAZOLE-BETAMETHASONE 1-0.05 % EX CREA: 1.0000 "application " | TOPICAL_CREAM | Freq: Two times a day (BID) | CUTANEOUS | 0 refills | Status: DC

## 2016-12-02 ENCOUNTER — Encounter: Payer: Self-pay | Admitting: Family Medicine

## 2016-12-03 ENCOUNTER — Encounter: Payer: Self-pay | Admitting: Family Medicine

## 2016-12-04 ENCOUNTER — Telehealth: Payer: Self-pay | Admitting: Family Medicine

## 2016-12-04 DIAGNOSIS — B9689 Other specified bacterial agents as the cause of diseases classified elsewhere: Secondary | ICD-10-CM

## 2016-12-04 DIAGNOSIS — N760 Acute vaginitis: Principal | ICD-10-CM

## 2016-12-04 NOTE — Telephone Encounter (Signed)
Pt is still having issues with BV.  She states that her and Creta LevinStallings had agreed if it is not getting any better, that pt did not need to come back for a follow up and that she will refer her to a gynecologist instead to avoid having to keep paying our copay.  Please advise [phone]

## 2016-12-05 ENCOUNTER — Ambulatory Visit: Payer: 59 | Admitting: Physician Assistant

## 2016-12-05 NOTE — Telephone Encounter (Signed)
Dr. Please advise.

## 2016-12-06 MED ORDER — METRONIDAZOLE 0.75 % VA GEL: 1.0000 | Freq: Two times a day (BID) | VAGINAL | 0 refills | Status: DC

## 2016-12-06 NOTE — Telephone Encounter (Signed)
Please notify the patient that I sent in some more of the metrogel and placed referral for Gynecology

## 2016-12-11 NOTE — Telephone Encounter (Signed)
Spoke with patient and advised that rx gel was sent into the pharmacy. Pt stated that they called her yesterday and she picked it up. Also advised that a referral was placed for gynecology.

## 2016-12-16 ENCOUNTER — Ambulatory Visit (INDEPENDENT_AMBULATORY_CARE_PROVIDER_SITE_OTHER): Payer: 59 | Admitting: Obstetrics and Gynecology

## 2016-12-16 ENCOUNTER — Encounter: Payer: Self-pay | Admitting: Obstetrics and Gynecology

## 2016-12-16 VITALS — BP 110/60 | HR 72 | Resp 14 | Ht 61.25 in | Wt 101.0 lb

## 2016-12-16 DIAGNOSIS — N760 Acute vaginitis: Secondary | ICD-10-CM | POA: Diagnosis not present

## 2016-12-16 DIAGNOSIS — B9689 Other specified bacterial agents as the cause of diseases classified elsewhere: Secondary | ICD-10-CM | POA: Diagnosis not present

## 2016-12-16 MED ORDER — METRONIDAZOLE 0.75 % VA GEL: VAGINAL | 8 refills | Status: DC

## 2016-12-16 MED ORDER — METRONIDAZOLE 500 MG PO TABS: 500.0000 mg | ORAL_TABLET | Freq: Two times a day (BID) | ORAL | 0 refills | Status: DC

## 2016-12-16 MED ORDER — PROMETHAZINE HCL 12.5 MG PO TABS: 12.5000 mg | ORAL_TABLET | Freq: Four times a day (QID) | ORAL | 0 refills | Status: DC | PRN

## 2016-12-16 NOTE — Progress Notes (Signed)
21 y.o. G1P0010 SingleAfrican AmericanF here referred by PCP, Dr Collie SiadZoe Castro for chronic BV.  The patient was treated for BV in 5/18. The patient reports at least 4-5 episodes of BV in the last 4-5 months. Her symptoms come cyclically, initially before then after her cycles. Currently not have symptoms.  Her symptoms are a fishy odor and an increase in a thick white/yellow d/c. No itching, burning or irritation.  Each time she has been treated for a week, she then did boric acid for 9 x over 2 weeks. The last time she was treated with gel, then the boric acid, then the gel.  Currently no vaginal discharge. She has gel that she hasn't started yet.  She was on OCP's in the past. She had an abortion 2 years ago, since then has had bad cramps. She takes ibuprofen for her cramps on occasion, doesn't really help. She doesn't currently want contraception. She uses condoms sometimes. Not trying to get pregnant, but okay if she does Period Cycle (Days): 28 Period Duration (Days): 4 days  Period Pattern: Regular Menstrual Flow: Moderate, Heavy Menstrual Control: Maxi pad Menstrual Control Change Freq (Hours): changes pad every 3 hours on heavy days  Dysmenorrhea: (!) Severe Dysmenorrhea Symptoms: Cramping  Patient's last menstrual period was 11/28/2016.          Sexually active: Yes.    The current method of family planning is condoms sometimes.    Exercising: No.  The patient does not participate in regular exercise at present. Smoker:  no  Health Maintenance: Pap:  Never TDaP:  unsure Gardasil: no    reports that she has never smoked. She has never used smokeless tobacco. She reports that she drinks about 0.6 oz of alcohol per week . She reports that she does not use drugs. She works at [employer]. Goes to [employer], psychology and criminal justice. Wants to work with kids with Autism, plans to go back to study art.   No past medical history on file.  No past surgical history  on file.  No current outpatient prescriptions on file.   No current facility-administered medications for this visit.     Family History  Problem Relation Age of Onset  . Cancer Maternal Grandmother   . Breast cancer Maternal Grandmother     Review of Systems  Constitutional: Negative.   HENT: Negative.   Eyes: Negative.   Respiratory: Negative.   Cardiovascular: Negative.   Gastrointestinal: Negative.   Endocrine: Negative.   Genitourinary:       Vaginal discharge and odor   Musculoskeletal: Negative.   Skin: Negative.   Allergic/Immunologic: Negative.   Neurological: Negative.   Psychiatric/Behavioral: Negative.     Exam:   BP 110/60   Pulse 72   Resp 14   Ht 5' 1.25" (1.556 m)   Wt 101 lb (45.8 kg)   LMP 11/28/2016   Breastfeeding? Unknown   BMI 18.93 kg/m   Weight change: @WEIGHTCHANGE @ Height:   Height: 5' 1.25" (155.6 cm)  Ht Readings from Last 3 Encounters:  12/16/16 5' 1.25" (1.556 m)  11/05/16 5' 1.5" (1.562 m)  10/04/16 5' 1.25" (1.556 m)    General appearance: alert, cooperative and appears stated age   Pelvic: External genitalia:  no lesions              Urethra:  normal appearing urethra with no masses, tenderness or lesions              Bartholins  and Skenes: normal                 Vagina: normal appearing vagina with an increase in watery, frothy, white vaginal d/c              Cervix: no lesions  Chaperone was present for exam.  Wet prep: ++ clue, no trich, rare wbc KOH: no yeast PH: 5.5   A:  Recurrent BV  Contraception, not consistently using condoms, declines OCP's  Dysmenorrhea, declines anaprox  P:   Treat with flagyl x 10 days, then metrogel x 4-6 months  She states she has had some nausea with the oral flagyl in the past, will give her phenergan for prn use  If she isn't tolerating the flagy, will change to metrogel  Recommend she use PNV if not using condoms  Wouldn't continue suppression if she is pregnant  Discussed  condoms for contraception and the help prevent recurrent BV  CC: Dr Collie Siad  Approximately 30 minutes was spent face to face with the patient, over 50% in counselling

## 2016-12-27 ENCOUNTER — Ambulatory Visit (INDEPENDENT_AMBULATORY_CARE_PROVIDER_SITE_OTHER): Payer: 59 | Admitting: Family Medicine

## 2016-12-27 ENCOUNTER — Telehealth: Payer: Self-pay

## 2016-12-27 ENCOUNTER — Encounter: Payer: Self-pay | Admitting: Family Medicine

## 2016-12-27 VITALS — BP 100/62 | HR 73 | Temp 98.3°F | Resp 17 | Ht 61.25 in | Wt 103.0 lb

## 2016-12-27 DIAGNOSIS — R1013 Epigastric pain: Secondary | ICD-10-CM | POA: Diagnosis not present

## 2016-12-27 DIAGNOSIS — R0789 Other chest pain: Secondary | ICD-10-CM | POA: Diagnosis not present

## 2016-12-27 DIAGNOSIS — R112 Nausea with vomiting, unspecified: Secondary | ICD-10-CM

## 2016-12-27 MED ORDER — FAMOTIDINE 20 MG PO TABS
20.0000 mg | ORAL_TABLET | Freq: Two times a day (BID) | ORAL | 1 refills | Status: DC
Start: 2016-12-27 — End: 2017-02-24

## 2016-12-27 NOTE — Patient Instructions (Addendum)
   IF you received an x-ray today, you will receive an invoice from [facility]. Please contact [facility] at [phone] with questions or concerns regarding your invoice.   IF you received labwork today, you will receive an invoice from [facility]. Please contact [facility] at [phone] with questions or concerns regarding your invoice.   Our billing staff will not be able to assist you with questions regarding bills from these companies.  You will be contacted with the lab results as soon as they are available. The fastest way to get your results is to activate your My Chart account. Instructions are located on the last page of this paperwork. If you have not heard from us regarding the results in 2 weeks, please contact this office.     Costochondritis Costochondritis is swelling and irritation (inflammation) of the tissue (cartilage) that connects your ribs to your breastbone (sternum). This causes pain in the front of your chest. The pain usually starts gradually and involves more than one rib. What are the causes? The exact cause of this condition is not always known. It results from stress on the cartilage where your ribs attach to your sternum. The cause of this stress could be:  Chest injury (trauma).  Exercise or activity, such as lifting.  Severe coughing.  What increases the risk? You may be at higher risk for this condition if you:  Are female.  Are 30?21 years old.  Recently started a new exercise or work activity.  Have low levels of vitamin D.  Have a condition that makes you cough frequently.  What are the signs or symptoms? The main symptom of this condition is chest pain. The pain:  Usually starts gradually and can be sharp or dull.  Gets worse with deep breathing, coughing, or exercise.  Gets better with rest.  May be worse when you press on the sternum-rib connection (tenderness).  How is this diagnosed? This condition is  diagnosed based on your symptoms, medical history, and a physical exam. Your health care provider will check for tenderness when pressing on your sternum. This is the most important finding. You may also have tests to rule out other causes of chest pain. These may include:  A chest X-ray to check for lung problems.  An electrocardiogram (ECG) to see if you have a heart problem that could be causing the pain.  An imaging scan to rule out a chest or rib fracture.  How is this treated? This condition usually goes away on its own over time. Your health care provider may prescribe an NSAID to reduce pain and inflammation. Your health care provider may also suggest that you:  Rest and avoid activities that make pain worse.  Apply heat or cold to the area to reduce pain and inflammation.  Do exercises to stretch your chest muscles.  If these treatments do not help, your health care provider may inject a numbing medicine at the sternum-rib connection to help relieve the pain. Follow these instructions at home:  Avoid activities that make pain worse. This includes any activities that use chest, abdominal, and side muscles.  If directed, put ice on the painful area: ? Put ice in a plastic bag. ? Place a towel between your skin and the bag. ? Leave the ice on for 20 minutes, 2-3 times a day.  If directed, apply heat to the affected area as often as told by your health care provider. Use the heat source that your health care provider   recommends, such as a moist heat pack or a heating pad. ? Place a towel between your skin and the heat source. ? Leave the heat on for 20-30 minutes. ? Remove the heat if your skin turns bright red. This is especially important if you are unable to feel pain, heat, or cold. You may have a greater risk of getting burned.  Take over-the-counter and prescription medicines only as told by your health care provider.  Return to your normal activities as told by your  health care provider. Ask your health care provider what activities are safe for you.  Keep all follow-up visits as told by your health care provider. This is important. Contact a health care provider if:  You have chills or a fever.  Your pain does not go away or it gets worse.  You have a cough that does not go away (is persistent). Get help right away if:  You have shortness of breath. This information is not intended to replace advice given to you by your health care provider. Make sure you discuss any questions you have with your health care provider. Document Released: 03/13/2005 Document Revised: 12/22/2015 Document Reviewed: 09/27/2015 Elsevier Interactive Patient Education  2018 Elsevier Inc.  

## 2016-12-27 NOTE — Telephone Encounter (Signed)
Spoke with patient and asked if she was able to come back into the office and have her blood redrawn because the sample that was collected was no good. Patient agreed to come in tomorrow morning for a labs only visit.

## 2016-12-27 NOTE — Progress Notes (Signed)
Chief Complaint  Patient presents with  . Chest Pain    neck pain with headache x 1 week on left side, second weekd stomach cramps with green bm/runny, chest pain is in the middle of torso and moves up.  Pt training with personal trainer and was pushed to much and had emesis w/ cp.  Yesterday CP all day -gone around 7 pm, bm brown but continued to be runny.  Pain level 10/10, CP comes in spurts and last 10mins at a time.  Ibuprofen on Wed for pain but nothing since.  . Abdominal Pain    HPI  Pt reports that she has been having onset of headaches 2 weeks ago and then onset of stomach and chest pains one week ago She reports that the headache has resolved but was mostly on the left side of her head and neck She states that when she started having stomach cramps a week ago she also had mid sternal chest pain With loose stools that were greens and vomiting twice with one episode after exercising strenuously in the gym and one episode yesterday after eating.  She reports that her BM are still runny She had chips and guacamole yesterday after throwing up She reports that she is training with a trainer at the gym  She has not eaten this morning She reports slight nausea She reports that she felt the chest pains this morning until she was in the lobby They are intermittent and last about every 10 mintures  She took ibuprofen 2 days ago which did not help.   History reviewed. No pertinent past medical history.  Current Outpatient Prescriptions  Medication Sig Dispense Refill  . metroNIDAZOLE (METROGEL) 0.75 % vaginal gel After you complete the oral flagyl, then start the metrogel, 1 applicator 2 x a week at bedtime for 6 months. 70 g 8  . famotidine (PEPCID) 20 MG tablet Take 1 tablet (20 mg total) by mouth 2 (two) times daily. Before meals 60 tablet 1   No current facility-administered medications for this visit.     Allergies: No Known Allergies  History reviewed. No pertinent  surgical history.  Social History   Social History  . Marital status: Single    Spouse name: N/A  . Number of children: N/A  . Years of education: N/A   Social History Main Topics  . Smoking status: Never Smoker  . Smokeless tobacco: Never Used  . Alcohol use 0.6 oz/week    1 Standard drinks or equivalent per week  . Drug use: No  . Sexual activity: Yes    Partners: Male    Birth control/ protection: None   Other Topics Concern  . None   Social History Narrative  . None    ROS See hpi  Objective: Vitals:   12/27/16 1010  BP: 100/62  Pulse: 73  Resp: 17  Temp: 98.3 F (36.8 C)  TempSrc: Oral  SpO2: 100%  Weight: 103 lb (46.7 kg)  Height: 5' 1.25" (1.556 m)    Physical Exam  Constitutional: She is oriented to person, place, and time. She appears well-developed and well-nourished.  HENT:  Head: Normocephalic and atraumatic.  Eyes: Conjunctivae and EOM are normal.  Cardiovascular: Normal rate, regular rhythm and normal heart sounds.   Pulmonary/Chest: Effort normal and breath sounds normal. No respiratory distress. She has no wheezes.  Abdominal: Normal appearance and bowel sounds are normal. There is no hepatosplenomegaly. There is tenderness in the epigastric area and left upper quadrant.  Neurological: She is alert and oriented to person, place, and time.  Skin: Skin is warm. Capillary refill takes less than 2 seconds. No erythema.    ECG- nsr, normal for age, no st elevation no twi   Assessment and Plan Dre'Yon was seen today for chest pain and abdominal pain.  Diagnoses and all orders for this visit:  Mid sternal chest pain- ecg without pathologic findings Likely gerd and costochondritis all from a very strenuous exercise program -     EKG 12-Lead  Non-intractable vomiting with nausea, unspecified vomiting type -     H. pylori breath test -     Comprehensive metabolic panel  Abdominal pain, epigastric- likely reflux Discussed how to modify  exercise routine and to wear loose fitting clothing Advised topical pain meds like aspercreme -     EKG 12-Lead -     H. pylori breath test -     Comprehensive metabolic panel -     famotidine (PEPCID) 20 MG tablet; Take 1 tablet (20 mg total) by mouth 2 (two) times daily. Before meals     Kelsye Loomer A Creta Levin

## 2016-12-31 LAB — H. PYLORI BREATH TEST: H pylori Breath Test: NEGATIVE

## 2017-01-03 LAB — COMPREHENSIVE METABOLIC PANEL

## 2017-02-24 ENCOUNTER — Other Ambulatory Visit: Payer: Self-pay | Admitting: Family Medicine

## 2017-02-24 NOTE — Telephone Encounter (Signed)
Refill request for famotidine 20 mg approved # [id number] with 0 refills.

## 2017-04-30 NOTE — Progress Notes (Addendum)
Chief Complaint  Patient presents with  . Routine Prenatal Visit    HPI   Pt is here for pregnancy confirmation Patient's last menstrual period was 03/22/2017 (exact date). She currently has morning sickness and is feeling very tired. She would like a referral to establish with OB.  This is her first pregnancy She is happy about the pregnancy and plans to proceed with prenatal care  No past medical history on file.  Current Outpatient Medications  Medication Sig Dispense Refill  . famotidine (PEPCID) 20 MG tablet TAKE 1 TABLET BY MOUTH TWICE DAILY BEFORE MEALS (Patient not taking: Reported on 05/01/2017) 60 tablet 0  . metroNIDAZOLE (METROGEL) 0.75 % vaginal gel After you complete the oral flagyl, then start the metrogel, 1 applicator 2 x a week at bedtime for 6 months. (Patient not taking: Reported on 05/01/2017) 70 g 8   No current facility-administered medications for this visit.     Allergies: No Known Allergies  No past surgical history on file.  Social History   Socioeconomic History  . Marital status: Single    Spouse name: None  . Number of children: None  . Years of education: None  . Highest education level: None  Social Needs  . Financial resource strain: None  . Food insecurity - worry: None  . Food insecurity - inability: None  . Transportation needs - medical: None  . Transportation needs - non-medical: None  Occupational History  . None  Tobacco Use  . Smoking status: Never Smoker  . Smokeless tobacco: Never Used  Substance and Sexual Activity  . Alcohol use: Yes    Alcohol/week: 0.6 oz    Types: 1 Standard drinks or equivalent per week  . Drug use: No  . Sexual activity: Yes    Partners: Male    Birth control/protection: None  Other Topics Concern  . None  Social History Narrative  . None    Family History  Problem Relation Age of Onset  . Cancer Maternal Grandmother   . Breast cancer Maternal Grandmother      Review of Systems    Constitutional: Negative for chills and fever.  Cardiovascular: Negative for chest pain and leg swelling.  Gastrointestinal: Positive for nausea. Negative for abdominal pain, blood in stool, constipation, diarrhea and vomiting.  Genitourinary: Negative for dysuria and urgency.  Skin: Negative for itching and rash.  Neurological: Negative for dizziness, tingling, tremors and headaches.   Review of Systems See HPI Constitution: No fevers or chills No malaise No diaphoresis Skin: No rash or itching Eyes: no blurry vision, no double vision GU: no dysuria or hematuria Neuro: no dizziness or headaches    Objective: Vitals:   05/01/17 0811  BP: 100/60  Pulse: 95  Resp: 16  Temp: 98.9 F (37.2 C)  TempSrc: Oral  SpO2: 100%  Weight: 99 lb 3.2 oz (45 kg)  Height: 5' 1.25" (1.556 m)    Physical Exam  Constitutional: She is oriented to person, place, and time. She appears well-developed and well-nourished.  HENT:  Head: Normocephalic and atraumatic.  Eyes: Conjunctivae and EOM are normal.  Cardiovascular: Normal rate, regular rhythm and normal heart sounds.  Pulmonary/Chest: Effort normal and breath sounds normal. No stridor. No respiratory distress.  Neurological: She is alert and oriented to person, place, and time.  Psychiatric: She has a normal mood and affect. Her behavior is normal. Judgment and thought content normal.    Positive HCG    Assessment and Plan Laurie Castro was seen today  for routine prenatal visit.  Diagnoses and all orders for this visit:  Possible pregnancy -     POCT urine pregnancy -     Ambulatory referral to Obstetrics / Gynecology  Less than [redacted] weeks gestation of pregnancy -     Ambulatory referral to Obstetrics / Gynecology  Patient here for pregnancy confirmation Estimated Due Date is 12/27/2017  Gestational Age Today is 5 week(s), 5 day(s) or 1.31 month(s) months  Date of Conception 04/05/2017 Advised establishing with prenatal care Advised  flu vaccination Discussed routine prenatal care   Laurie Castro

## 2017-05-01 ENCOUNTER — Ambulatory Visit: Payer: 59 | Admitting: Family Medicine

## 2017-05-01 ENCOUNTER — Encounter: Payer: Self-pay | Admitting: Family Medicine

## 2017-05-01 ENCOUNTER — Other Ambulatory Visit: Payer: Self-pay

## 2017-05-01 VITALS — BP 100/60 | HR 95 | Temp 98.9°F | Resp 16 | Ht 61.25 in | Wt 99.2 lb

## 2017-05-01 DIAGNOSIS — Z3A01 Less than 8 weeks gestation of pregnancy: Secondary | ICD-10-CM

## 2017-05-01 DIAGNOSIS — Z3200 Encounter for pregnancy test, result unknown: Secondary | ICD-10-CM | POA: Diagnosis not present

## 2017-05-01 LAB — POCT URINE PREGNANCY: Preg Test, Ur: POSITIVE — AB

## 2017-05-01 NOTE — Patient Instructions (Addendum)
IF you received an x-ray today, you will receive an invoice from [facility]. Please contact [facility] at [phone] with questions or concerns regarding your invoice.   IF you received labwork today, you will receive an invoice from [facility]. Please contact [facility] at [phone] with questions or concerns regarding your invoice.   Our billing staff will not be able to assist you with questions regarding bills from these companies.  You will be contacted with the lab results as soon as they are available. The fastest way to get your results is to activate your My Chart account. Instructions are located on the last page of this paperwork. If you have not heard from [place] regarding the results in 2 weeks, please contact this office.     Prenatal Care WHAT IS PRENATAL CARE? Prenatal care is the process of caring for a pregnant woman before she gives birth. Prenatal care makes sure that she and her baby remain as healthy as possible throughout pregnancy. Prenatal care may be provided by a midwife, family practice health care provider, or a childbirth and pregnancy specialist (obstetrician). Prenatal care may include physical examinations, testing, treatments, and education on nutrition, lifestyle, and social support services. WHY IS PRENATAL CARE SO IMPORTANT? Early and consistent prenatal care increases the chance that you and your baby will remain healthy throughout your pregnancy. This type of care also decreases a baby's risk of being born too early (prematurely), or being born smaller than expected (small for gestational age). Any underlying medical conditions you may have that could pose a risk during your pregnancy are discussed during prenatal care visits. You will also be monitored regularly for any new conditions that may arise during your pregnancy so they can be treated quickly and effectively. WHAT HAPPENS DURING PRENATAL CARE VISITS? Prenatal care visits may  include the following: Discussion Tell your health care provider about any new signs or symptoms you have experienced since your last visit. These might include:  Nausea or vomiting.  Increased or decreased level of energy.  Difficulty sleeping.  Back or leg pain.  Weight changes.  Frequent urination.  Shortness of breath with physical activity.  Changes in your skin, such as the development of a rash or itchiness.  Vaginal discharge or bleeding.  Feelings of excitement or nervousness.  Changes in your baby's movements.  You may want to write down any questions or topics you want to discuss with your health care provider and bring them with you to your appointment. Examination During your first prenatal care visit, you will likely have a complete physical exam. Your health care provider will often examine your vagina, cervix, and the position of your uterus, as well as check your heart, lungs, and other body systems. As your pregnancy progresses, your health care provider will measure the size of your uterus and your baby's position inside your uterus. He or she may also examine you for early signs of labor. Your prenatal visits may also include checking your blood pressure and, after about 10-12 weeks of pregnancy, listening to your baby's heartbeat. Testing Regular testing often includes:  Urinalysis. This checks your urine for glucose, protein, or signs of infection.  Blood count. This checks the levels of white and red blood cells in your body.  Tests for sexually transmitted infections (STIs). Testing for STIs at the beginning of pregnancy is routinely done and is required in many states.  Antibody testing. You will be checked to see if you are immune  to certain illnesses, such as rubella, that can affect a developing fetus.  Glucose screen. Around 24-28 weeks of pregnancy, your blood glucose level will be checked for signs of gestational diabetes. Follow-up tests may be  recommended.  Group B strep. This is a bacteria that is commonly found inside a woman's vagina. This test will inform your health care provider if you need an antibiotic to reduce the amount of this bacteria in your body prior to labor and childbirth.  Ultrasound. Many pregnant women undergo an ultrasound screening around 18-20 weeks of pregnancy to evaluate the health of the fetus and check for any developmental abnormalities.  HIV (human immunodeficiency virus) testing. Early in your pregnancy, you will be screened for HIV. If you are at high risk for HIV, this test may be repeated during your third trimester of pregnancy.  You may be offered other testing based on your age, personal or family medical history, or other factors. HOW OFTEN SHOULD I PLAN TO SEE MY HEALTH CARE PROVIDER FOR PRENATAL CARE? Your prenatal care check-up schedule depends on any medical conditions you have before, or develop during, your pregnancy. If you do not have any underlying medical conditions, you will likely be seen for checkups:  Monthly, during the first 6 months of pregnancy.  Twice a month during months 7 and 8 of pregnancy.  Weekly starting in the 9th month of pregnancy and until delivery.  If you develop signs of early labor or other concerning signs or symptoms, you may need to see your health care provider more often. Ask your health care provider what prenatal care schedule is best for you. WHAT CAN I DO TO KEEP MYSELF AND MY BABY AS HEALTHY AS POSSIBLE DURING MY PREGNANCY?  Take a prenatal vitamin containing 400 micrograms (0.4 mg) of folic acid every day. Your health care provider may also ask you to take additional vitamins such as iodine, vitamin D, iron, copper, and zinc.  Take 1500-2000 mg of calcium daily starting at your 20th week of pregnancy until you deliver your baby.  Make sure you are up to date on your vaccinations. Unless directed otherwise by your health care provider: ? You should  receive a tetanus, diphtheria, and pertussis (Tdap) vaccination between the 27th and 36th week of your pregnancy, regardless of when your last Tdap immunization occurred. This helps protect your baby from whooping cough (pertussis) after he or she is born. ? You should receive an annual inactivated influenza vaccine (IIV) to help protect you and your baby from influenza. This can be done at any point during your pregnancy.  Eat a well-rounded diet that includes: ? Fresh fruits and vegetables. ? Lean proteins. ? Calcium-rich foods such as milk, yogurt, hard cheeses, and dark, leafy greens. ? Whole grain breads.  Do noteat seafood high in mercury, including: ? Swordfish. ? Tilefish. ? Shark. ? King mackerel. ? More than 6 oz tuna per week.  Do not eat: ? Raw or undercooked meats or eggs. ? Unpasteurized foods, such as soft cheeses (brie, blue, or feta), juices, and milks. ? Lunch meats. ? Hot dogs that have not been heated until they are steaming.  Drink enough water to keep your urine clear or pale yellow. For many women, this may be 10 or more 8 oz glasses of water each day. Keeping yourself hydrated helps deliver nutrients to your baby and may prevent the start of pre-term uterine contractions.  Do not use any tobacco products including cigarettes, chewing tobacco, or  electronic cigarettes. If you need help quitting, ask your health care provider.  Do not drink beverages containing alcohol. No safe level of alcohol consumption during pregnancy has been determined.  Do not use any illegal drugs. These can harm your developing baby or cause a miscarriage.  Ask your health care provider or pharmacist before taking any prescription or over-the-counter medicines, herbs, or supplements.  Limit your caffeine intake to no more than 200 mg per day.  Exercise. Unless told otherwise by your health care provider, try to get 30 minutes of moderate exercise most days of the week. Do not  do  high-impact activities, contact sports, or activities with a high risk of falling, such as horseback riding or downhill skiing.  Get plenty of rest.  Avoid anything that raises your body temperature, such as hot tubs and saunas.  If you own a cat, do not empty its litter box. Bacteria contained in cat feces can cause an infection called toxoplasmosis. This can result in serious harm to the fetus.  Stay away from chemicals such as insecticides, lead, mercury, and cleaning or paint products that contain solvents.  Do not have any X-rays taken unless medically necessary.  Take a childbirth and breastfeeding preparation class. Ask your health care provider if you need a referral or recommendation.  This information is not intended to replace advice given to you by your health care provider. Make sure you discuss any questions you have with your health care provider. Document Released: 06/06/2003 Document Revised: 11/06/2015 Document Reviewed: 08/18/2013 Elsevier Interactive Patient Education  2017 [employer]

## 2017-06-02 ENCOUNTER — Ambulatory Visit (INDEPENDENT_AMBULATORY_CARE_PROVIDER_SITE_OTHER): Payer: 59 | Admitting: Medical

## 2017-06-02 ENCOUNTER — Encounter: Payer: Self-pay | Admitting: Medical

## 2017-06-02 ENCOUNTER — Encounter: Payer: Self-pay | Admitting: General Practice

## 2017-06-02 DIAGNOSIS — Z3480 Encounter for supervision of other normal pregnancy, unspecified trimester: Secondary | ICD-10-CM

## 2017-06-02 DIAGNOSIS — Z113 Encounter for screening for infections with a predominantly sexual mode of transmission: Secondary | ICD-10-CM

## 2017-06-02 DIAGNOSIS — Z124 Encounter for screening for malignant neoplasm of cervix: Secondary | ICD-10-CM | POA: Diagnosis not present

## 2017-06-02 DIAGNOSIS — Z3481 Encounter for supervision of other normal pregnancy, first trimester: Secondary | ICD-10-CM

## 2017-06-02 LAB — POCT URINALYSIS DIP (DEVICE)
Bilirubin Urine: NEGATIVE
Glucose, UA: NEGATIVE mg/dL
Hgb urine dipstick: NEGATIVE
Ketones, ur: NEGATIVE mg/dL
Leukocytes, UA: NEGATIVE
NITRITE: NEGATIVE
PH: 6.5 (ref 5.0–8.0)
PROTEIN: NEGATIVE mg/dL
Specific Gravity, Urine: 1.025 (ref 1.005–1.030)
UROBILINOGEN UA: 0.2 mg/dL (ref 0.0–1.0)

## 2017-06-02 NOTE — Progress Notes (Signed)
   PRENATAL VISIT NOTE  Subjective:  Laurie Castro is a 21 y.o. G2P0010 at 6075w2d being seen today for her first prenatal care visit.  She is currently monitored for the following issues for this low-risk pregnancy and has Eczema and Supervision of other normal pregnancy, antepartum on their problem list.  Patient reports nausea and vomiting.  Contractions: Not present. Vag. Bleeding: None.  Movement: Absent. Denies leaking of fluid.   The following portions of the patient's history were reviewed and updated as appropriate: allergies, current medications, past family history, past medical history, past social history, past surgical history and problem list. Problem list updated.  Objective:   Vitals:   06/02/17 0950  BP: 104/66  Pulse: 91  Weight: 98 lb 1.6 oz (44.5 kg)    Fetal Status: Fetal Heart Rate (bpm): 160   Movement: Absent     General:  Alert, oriented and cooperative. Patient is in no acute distress.  Skin: Skin is warm and dry. No rash noted.   Cardiovascular: Normal heart rate and rhythm noted  Respiratory: Normal respiratory effort, no problems with respiration noted Clear to auscultation   Abdomen: Soft, gravid, appropriate for gestational age.  Pain/Pressure: Present     Pelvic: Cervical exam performed      closed, thick Normal discharge. Cervix with normal contour, no lesions. Pap smear obtained Breast: declined  Extremities: Normal range of motion.  Edema: None  Mental Status:  Normal mood and affect. Normal behavior. Normal judgment and thought content.   Assessment and Plan:  Pregnancy: G2P0010 at 1975w2d  1. Supervision of other normal pregnancy, antepartum - Babyscripts Schedule Optimization - Hemoglobinopathy evaluation - Culture, OB Urine - Obstetric Panel, Including HIV - US MFM Fetal Nuchal Translucency; scheduled - Cytology - PAP  Discussed the nature of our practice and multiple providers involved in care Discussed options for prenatal  classes Discussed OTC and diet options for managing N/V, patient states N/V has improved recently and declines Rx today  Discussed Tylenol use for frequent headache PRN  Discussed increased PO hydration for intermittent dizziness and symptomatic maternal hypotension   first trimester warning symptoms and general obstetric precautions including but not limited to vaginal bleeding, contractions, leaking of fluid and fetal movement were reviewed in detail with the patient. Please refer to After Visit Summary for other counseling recommendations.  Return in about 4 weeks (around 06/30/2017) for LOB.   Vonzella NippleJulie Maxie Debose, PA-C

## 2017-06-02 NOTE — Patient Instructions (Addendum)
Childbirth Education Options: [facility] Department Classes:  Childbirth education classes can help you get ready for a positive parenting experience. You can also meet other expectant parents and get free stuff for your baby. Each class runs for five weeks on the same night and costs $45 for the mother-to-be and her support person. Medicaid covers the cost if you are eligible. Call [phone] to register. [facility] Childbirth Education:  [phone] or [phone] or [email or web address]  Baby & Me Class: Discuss newborn & infant parenting and family adjustment issues with other new mothers in a relaxed environment. Each week brings a new speaker or baby-centered activity. We encourage new mothers to join [place] every Thursday at 11:00am. Babies birth until crawling. No registration or fee. Daddy [employer]: This course offers Dads-to-be the tools and knowledge needed to feel confident on their journey to becoming new fathers. Experienced dads, who have been trained as coaches, teach dads-to-be how to hold, comfort, diaper, swaddle and play with their infant while being able to support the new mom as well. A class for men taught by men. $25/dad Big Brother/Big Sister: Let your children share in the joy of a new brother or sister in this special class designed just for them. Class includes discussion about how families care for babies: swaddling, holding, diapering, safety as well as how they can be helpful in their new role. This class is designed for children ages 45 to 48, but any age is welcome. Please register each child individually. $5/child  Mom Talk: This mom-led group offers support and connection to mothers as they journey through the adjustments and struggles of that sometimes overwhelming first year after the birth of a child. Tuesdays at 10:00am and Thursdays at 6:00pm. Babies welcome. No registration or fee. Breastfeeding Support Group: This group is a mother-to-mother  support circle where moms have the opportunity to share their breastfeeding experiences. A Lactation Consultant is present for questions and concerns. Meets each Tuesday at 11:00am. No fee or registration. Breastfeeding Your Baby: Learn what to expect in the first days of breastfeeding your newborn.  This class will help you feel more confident with the skills needed to begin your breastfeeding experience. Many new mothers are concerned about breastfeeding after leaving the hospital. This class will also address the most common fears and challenges about breastfeeding during the first few weeks, months and beyond. (call for fee) Comfort Techniques and Tour: This 2 hour interactive class will provide you the opportunity to learn & practice hands-on techniques that can help relieve some of the discomfort of labor and encourage your baby to rotate toward the best position for birth. You and your partner will be able to try a variety of labor positions with birth balls and rebozos as well as practice breathing, relaxation, and visualization techniques. A tour of the [facility] is included with this class. $20 per registrant and support person Childbirth Class- Weekend Option: This class is a Weekend version of our Birth & Baby series. It is designed for parents who have a difficult time fitting several weeks of classes into their schedule. It covers the care of your newborn and the basics of labor and childbirth. It also includes a [place] of [facility] and lunch. The class is held two consecutive days: beginning on Friday evening from 6:30 - 8:30 p.m. and the next day, Saturday from 9 a.m. - 4 p.m. (call for fee) Doren Custard Class: Interested in a waterbirth?  This  informational class will help you discover whether waterbirth is the right fit for you. Education about waterbirth itself, supplies you would need and how to assemble your support team is what you can  expect from this class. Some obstetrical practices require this class in order to pursue a waterbirth. (Not all obstetrical practices offer waterbirth-check with your healthcare provider.) Register only the expectant mom, but you are encouraged to bring your partner to class! Required if planning waterbirth, no fee. Infant/Child CPR: Parents, grandparents, babysitters, and friends learn Cardio-Pulmonary Resuscitation skills for infants and children. You will also learn how to treat both conscious and unconscious choking in infants and children. This Family & Friends program does not offer certification. Register each participant individually to ensure that enough mannequins are available. (Call for fee) Grandparent Love: Expecting a grandbaby? This class is for you! Learn about the latest infant care and safety recommendations and ways to support your own child as he or she transitions into the parenting role. Taught by Registered Nurses who are childbirth instructors, but most importantly...they are grandmothers too! $10/person. Childbirth Class- Natural Childbirth: This series of 5 weekly classes is for expectant parents who want to learn and practice natural methods of coping with the process of labor and childbirth. Relaxation, breathing, massage, visualization, role of the partner, and helpful positioning are highlighted. Participants learn how to be confident in their body's ability to give birth. This class will empower and help parents make informed decisions about their own care. Includes discussion that will help new parents transition into the immediate postpartum period. Maternity Care Center Tour of [facility] is included. We suggest taking this class between 25-32 weeks, but it's only a recommendation. $75 per registrant and one support person or $30 Medicaid. Childbirth Class- 3 week Series: This option of 3 weekly classes helps you and your labor partner prepare for childbirth. Newborn  care, labor & birth, cesarean birth, pain management, and comfort techniques are discussed and a Maternity Care Center Tour of [facility] is included. The class meets at the same time, on the same day of the week for 3 consecutive weeks beginning with the starting date you choose. $60 for registrant and one support person.  Marvelous Multiples: Expecting twins, triplets, or more? This class covers the differences in labor, birth, parenting, and breastfeeding issues that face multiples' parents. NICU tour is included. Led by a Certified Childbirth Educator who is the mother of twins. No fee. Caring for Baby: This class is for expectant and adoptive parents who want to learn and practice the most up-to-date newborn care for their babies. Focus is on birth through the first six weeks of life. Topics include feeding, bathing, diapering, crying, umbilical cord care, circumcision care and safe sleep. Parents learn to recognize symptoms of illness and when to call the pediatrician. Register only the mom-to-be and your partner or support person can plan to come with you! $10 per registrant and support person Childbirth Class- online option: This online class offers you the freedom to complete a Birth and Baby series in the comfort of your own home. The flexibility of this option allows you to review sections at your own pace, at times convenient to you and your support people. It includes additional video information, animations, quizzes, and extended activities. Get organized with helpful eClass tools, checklists, and trackers. Once you register online for the class, you will receive an email within a few days to accept the invitation and begin the class when the time   is right for you. The content will be available to you for 60 days. $60 for 60 days of online access for you and your support people.  Local Doulas: Natural Baby Doulas [email or web address] Tel:  [phone] [email or web address] [employer] [phone] [email or web address] The Labor Hassell Halim  (also do waterbirth tub rental) [phone] [email or web address] [facility] Birth Doula [phone] [email or web address] NotebookDistributors.fi Sacred Rhythms  [phone] https://[email or web address]-[email or web address]/ [employer] (PADA) [email or web address] https://www.[email or web address]/ [place] Birth and Baby  [email or web address]/ Considering Waterbirth? Guide for patients at [facility] [employer]  Why consider waterbirth?  . Gentle birth for babies . Less pain medicine used in labor . May allow for passive descent/less pushing . May reduce perineal tears  . More mobility and instinctive maternal position changes . Increased maternal relaxation . Reduced blood pressure in labor  Is waterbirth safe? What are the risks of infection, drowning or other complications?  . Infection: o Very low risk (3.7 % for tub vs 4.8% for bed) o 7 in 8000 waterbirths with documented infection o Poorly cleaned equipment most common cause o Slightly lower group B strep transmission rate  . Drowning o Maternal:  - Very low risk   - Related to seizures or fainting o Newborn:  - Very low risk. No evidence of increased risk of respiratory problems in multiple large studies - Physiological protection from breathing under water - Avoid underwater birth if there are any fetal complications - Once baby's head is out of the water, keep it out.  . Birth complication o Some reports of cord trauma, but risk decreased by bringing baby to surface gradually o No evidence of increased risk of shoulder dystocia. Mothers can usually change positions faster in water than in a bed, possibly aiding the maneuvers to free the shoulder.   You must attend a Doren Custard class at [facility]  3rd Wednesday of every month from 7-9pm  [employer] by calling [phone] or online at [email or web address]  Bring [place] the certificate from the class to your prenatal appointment  Meet with a midwife at 36 weeks to see if you can still plan a waterbirth and to sign the consent.   Purchase or rent the following supplies:   Water Birth Pool (Birth Pool in a Box or [place] for instance)  (Tubs start ~$125)  Single-use disposable tub liner designed for your brand of tub  New garden hose labeled "lead-free", "suitable for drinking water",  Electric drain pump to remove water (We recommend 792 gallon per hour or greater pump.)   Separate garden hose to remove the dirty water  Fish net  Bathing suit top (optional)  Long-handled mirror (optional)  Places to purchase or rent supplies  GotWebTools.is for tub purchases and supplies  Waterbirthsolutions.com for tub purchases and supplies  The Labor Ladies (www.[email or web address]) $275 for tub rental/set-up & take down/kit   [employer] (http://www.fleming.com/.htm) Information regarding doulas (labor support) who provide pool rentals  Our practice has a Birth Pool in a Box tub at the hospital that you may borrow on a first-come-first-served basis. It is your responsibility to to set up, clean and break down the tub. We cannot guarantee the availability of this tub in advance. You are responsible for bringing all accessories listed above. If you do not have all necessary supplies you cannot have a waterbirth.    Things that would prevent you from having a waterbirth:  Premature, <37wks  Previous cesarean birth  Presence of thick meconium-stained fluid  Multiple gestation (Twins,  triplets, etc.)  Uncontrolled diabetes or gestational diabetes requiring medication  Hypertension requiring medication or diagnosis of pre-eclampsia  Heavy vaginal bleeding  Non-reassuring fetal  heart rate  Active infection (MRSA, etc.). Group B Strep is NOT a contraindication for  waterbirth.  If your labor has to be induced and induction method requires continuous  monitoring of the baby's heart rate  Other risks/issues identified by your obstetrical provider  Please remember that birth is unpredictable. Under certain unforeseeable circumstances your provider may advise against giving birth in the tub. These decisions will be made on a case-by-case basis and with the safety of you and your baby as our highest priority.      Eating Plan for Nausea and Vomiting in Pregnancy Hyperemesis gravidarum is a severe form of morning sickness. Because this condition causes severe nausea and vomiting, it can lead to dehydration, malnutrition, and weight loss. One way to lessen the symptoms of nausea and vomiting is to follow the eating plan for hyperemesis gravidarum. It is often used along with prescribed medicines to control your symptoms. What can I do to relieve my symptoms? Listen to your body. Everyone is different and has different preferences. Find what works best for you. Take any of the following actions that are helpful to you:  Eat and drink slowly.  Eat 5-6 small meals daily instead of 3 large meals.  Eat crackers before you get out of bed in the morning.  Try having a snack in the middle of the night.  Starchy foods are usually tolerated well. Examples include cereal, toast, bread, potatoes, pasta, rice, and pretzels.  Ginger may help with nausea. Add  tsp ground ginger to hot tea or choose ginger tea.  Try drinking 100% fruit juice or an electrolyte drink. An electrolyte drink contains sodium, potassium, and chloride.  Continue to take your prenatal vitamins as told by your health care provider. If you are having trouble taking your prenatal vitamins, talk with your health care provider about different options.  Include at least 1 serving of protein with your meals  and snacks. Protein options include meats or poultry, beans, nuts, eggs, and yogurt. Try eating a protein-rich snack before bed. Examples of these snacks include cheese and crackers or half of a peanut butter or [place] sandwich.  Consider eliminating foods that trigger your symptoms. These may include spicy foods, coffee, high-fat foods, very sweet foods, and acidic foods.  Try meals that have more protein combined with bland, salty, lower-fat, and dry foods, such as nuts, seeds, pretzels, crackers, and cereal.  Talk with your healthcare provider about starting a supplement of vitamin B6.  Have fluids that are cold, clear, and carbonated or sour. Examples include lemonade, ginger ale, lemon-lime soda, ice water, and sparkling water.  Try lemon or mint tea.  Try brushing your teeth or using a mouth rinse after meals.  What should I avoid to reduce my symptoms? Avoiding some of the following things may help reduce your symptoms.  Foods with strong smells. Try eating meals in well-ventilated areas that are free of odors.  Drinking water or other beverages with meals. Try not to drink anything during the 30 minutes before and after your meals.  Drinking more than 1 cup of fluid at a time. Sometimes using a straw helps.  Fried or high-fat foods, such as butter and cream sauces.  Spicy foods.  Skipping meals as best as you can. Nausea can be more intense on an empty stomach. If you cannot  tolerate food at that time, do not force it. Try sucking on ice chips or other frozen items, and make up for missed calories later.  Lying down within 2 hours after eating.  Environmental triggers. These may include smoky rooms, closed spaces, rooms with strong smells, warm or humid places, overly loud and noisy rooms, and rooms with motion or flickering lights.  Quick and sudden changes in your movement.  This information is not intended to replace advice given to you by your health care provider. Make  sure you discuss any questions you have with your health care provider. Document Released: 03/31/2007 Document Revised: 01/31/2016 Document Reviewed: 01/02/2016 Elsevier Interactive Patient Education  [employer].

## 2017-06-03 LAB — CYTOLOGY - PAP
Chlamydia: NEGATIVE
Diagnosis: NEGATIVE
Neisseria Gonorrhea: NEGATIVE

## 2017-06-04 ENCOUNTER — Other Ambulatory Visit: Payer: Self-pay | Admitting: Medical

## 2017-06-04 DIAGNOSIS — Z3480 Encounter for supervision of other normal pregnancy, unspecified trimester: Secondary | ICD-10-CM

## 2017-06-04 LAB — OBSTETRIC PANEL, INCLUDING HIV
Antibody Screen: NEGATIVE
Basophils Absolute: 0 10*3/uL (ref 0.0–0.2)
Basos: 0 %
EOS (ABSOLUTE): 0 10*3/uL (ref 0.0–0.4)
EOS: 0 %
HEMOGLOBIN: 13.5 g/dL (ref 11.1–15.9)
HEP B S AG: NEGATIVE
HIV SCREEN 4TH GENERATION: NONREACTIVE
Hematocrit: 38.6 % (ref 34.0–46.6)
IMMATURE GRANULOCYTES: 0 %
Immature Grans (Abs): 0 10*3/uL (ref 0.0–0.1)
LYMPHS ABS: 2 10*3/uL (ref 0.7–3.1)
Lymphs: 23 %
MCH: 32 pg (ref 26.6–33.0)
MCHC: 35 g/dL (ref 31.5–35.7)
MCV: 92 fL (ref 79–97)
Monocytes Absolute: 0.5 10*3/uL (ref 0.1–0.9)
Monocytes: 6 %
NEUTROS ABS: 6.3 10*3/uL (ref 1.4–7.0)
Neutrophils: 71 %
Platelets: 243 10*3/uL (ref 150–379)
RBC: 4.22 x10E6/uL (ref 3.77–5.28)
RDW: 13.4 % (ref 12.3–15.4)
RH TYPE: POSITIVE
RPR: NONREACTIVE
Rubella Antibodies, IGG: 4.53 index (ref >0.99)
WBC: 8.8 10*3/uL (ref 3.4–10.8)

## 2017-06-04 LAB — HEMOGLOBINOPATHY EVALUATION
HEMOGLOBIN F QUANTITATION: 0 % (ref 0.0–2.0)
HGB A: 97.6 % (ref 96.4–98.8)
HGB C: 0 %
HGB S: 0 %
HGB VARIANT: 0 %
Hemoglobin A2 Quantitation: 2.4 % (ref 1.8–3.2)

## 2017-06-04 LAB — URINE CULTURE, OB REFLEX

## 2017-06-04 LAB — CULTURE, OB URINE

## 2017-06-09 DIAGNOSIS — Z3480 Encounter for supervision of other normal pregnancy, unspecified trimester: Secondary | ICD-10-CM

## 2017-06-17 NOTE — L&D Delivery Note (Signed)
Patient is 22 y.o. G2P0010 3256w1d admitted for IOL for post dates.. S/p IOL with foley bulb, followed by Pitocin. AROM at 0700..  Delivery Note At 7:05 PM a viable female was delivered via Vaginal, Spontaneous (Presentation: vertex ; OA  ).  APGAR: 7, 9; weight  pending.   Placenta status:intact ,3 vessel Cord:    Head delivered OA. No nuchal cord present. Shoulder and body delivered in usual fashion. Infant with spontaneous cry, placed on mother's abdomen, dried and bulb suctioned. Cord clamped x 2 after 1-minute delay, and cut by family member. Cord blood drawn. Placenta delivered spontaneously with gentle cord traction. Fundus firm with massage and Pitocin. Perineum inspected and found to have 3 lateral laceration, which was repaired with 3.0 vicryl with good hemostasis achieved.   Anesthesia: epidural  Episiotomy: None Lacerations:  2 left sided lacerations. 1 right sided laceration Suture Repair: 3.0vicryl Est. Blood Loss (mL): 250  Mom to postpartum.  Baby to Couplet care / Skin to Skin.  Laurie Castro 12/26/2017, 8:23 PM

## 2017-06-23 ENCOUNTER — Encounter (HOSPITAL_COMMUNITY): Payer: Self-pay | Admitting: Medical

## 2017-06-25 ENCOUNTER — Encounter (HOSPITAL_COMMUNITY): Payer: Self-pay

## 2017-06-25 ENCOUNTER — Ambulatory Visit (HOSPITAL_COMMUNITY)
Admission: RE | Admit: 2017-06-25 | Discharge: 2017-06-25 | Disposition: A | Discharge status: Home / Self Care | Payer: 59 | Source: Ambulatory Visit | Attending: Medical | Admitting: Medical

## 2017-06-25 ENCOUNTER — Other Ambulatory Visit: Payer: Self-pay | Admitting: Medical

## 2017-06-25 ENCOUNTER — Ambulatory Visit (HOSPITAL_COMMUNITY): Admission: RE | Admit: 2017-06-25 | Payer: 59 | Source: Ambulatory Visit

## 2017-06-25 DIAGNOSIS — Z3480 Encounter for supervision of other normal pregnancy, unspecified trimester: Secondary | ICD-10-CM | POA: Diagnosis present

## 2017-06-25 DIAGNOSIS — Z3A14 14 weeks gestation of pregnancy: Secondary | ICD-10-CM

## 2017-06-25 DIAGNOSIS — Z363 Encounter for antenatal screening for malformations: Secondary | ICD-10-CM | POA: Diagnosis not present

## 2017-06-25 DIAGNOSIS — Z3482 Encounter for supervision of other normal pregnancy, second trimester: Secondary | ICD-10-CM | POA: Diagnosis not present

## 2017-06-25 HISTORY — DX: Gastro-esophageal reflux disease without esophagitis: K21.9

## 2017-06-25 HISTORY — DX: Urinary tract infection, site not specified: N39.0

## 2017-06-25 HISTORY — DX: Other specified bacterial agents as the cause of diseases classified elsewhere: B96.89

## 2017-06-25 HISTORY — DX: Other specified bacterial agents as the cause of diseases classified elsewhere: N76.0

## 2017-06-25 NOTE — Addendum Note (Signed)
Encounter addended by: Melynda KellerVics, Prestin Munch R, RDMS on: 06/25/2017 4:27 PM  Actions taken: Imaging Exam ended

## 2017-07-02 ENCOUNTER — Ambulatory Visit (INDEPENDENT_AMBULATORY_CARE_PROVIDER_SITE_OTHER): Payer: 59

## 2017-07-02 VITALS — BP 104/64 | HR 80 | Wt 105.1 lb

## 2017-07-02 DIAGNOSIS — G44209 Tension-type headache, unspecified, not intractable: Secondary | ICD-10-CM

## 2017-07-02 DIAGNOSIS — Z3480 Encounter for supervision of other normal pregnancy, unspecified trimester: Secondary | ICD-10-CM

## 2017-07-02 DIAGNOSIS — Z3482 Encounter for supervision of other normal pregnancy, second trimester: Secondary | ICD-10-CM

## 2017-07-02 NOTE — Progress Notes (Signed)
Pt states for the past week and a half has had a tension headache going down the left side. Sharpe pains @ top of stomach. Pt also states based on previous US that she should be at 4336w5d.

## 2017-07-02 NOTE — Progress Notes (Signed)
   PRENATAL VISIT NOTE  Subjective:  Laurie Castro is a 22 y.o. G2P0010 at 8179w6d being seen today for ongoing prenatal care.  She is currently monitored for the following issues for this low-risk pregnancy and has Eczema and Supervision of other normal pregnancy, antepartum on their problem list.  Patient reports headache. She states the pain starts with tension in her neck and has been ongoing for 3 days. She tried a massage that helped but the pain came back. Has not tried any medication for the pain.  Contractions: Not present. Vag. Bleeding: None.  Movement: Absent. Denies leaking of fluid.   The following portions of the patient's history were reviewed and updated as appropriate: allergies, current medications, past family history, past medical history, past social history, past surgical history and problem list. Problem list updated.  Objective:   Vitals:   07/02/17 1130  BP: 104/64  Pulse: 80  Weight: 105 lb 1.6 oz (47.7 kg)    Fetal Status: Fetal Heart Rate (bpm): 150   Movement: Absent     General:  Alert, oriented and cooperative. Patient is in no acute distress.  Skin: Skin is warm and dry. No rash noted.   Cardiovascular: Normal heart rate noted  Respiratory: Normal respiratory effort, no problems with respiration noted  Abdomen: Soft, gravid, appropriate for gestational age.  Pain/Pressure: Present     Pelvic: Cervical exam deferred        Extremities: Normal range of motion.  Edema: None  Mental Status:  Normal mood and affect. Normal behavior. Normal judgment and thought content.   Assessment and Plan:  Pregnancy: G2P0010 at 7179w6d  1. Supervision of other normal pregnancy, antepartum The patient is progressing well and denies any bleeding, discharge, or pelvic pain.  The patient was counseled on the significance of choroid plexus cyst noted on US on 1/09 and reassured that in most cases these cysts resolve on their own.  However, genetic testing was an given as an  option to determine likelihood of underlying genetic abnormality.  The pros and cons of genetic counseling and the significance of their results was discussed with the patient and spouse.  After, all questions were answered, the patient decided to go ahead with a quad screen. Patient was also scheduled for an US appointment on 02/06.    - US MFM OB COMP + 14 WK; Future - AFP TETRA  2. Tension-type headache, not intractable, unspecified chronicity pattern Headache is described as "tension type" that she has had for over a week now and is associated with pain in her neck.  She has not tried tylenol but says that she is staying hydrated.  Patient notes that massages seem to make it better and she is getting a massage about once a month.  Patient will try tylenol and call back if pain continues to persist.    Term labor symptoms and general obstetric precautions including but not limited to vaginal bleeding, contractions, leaking of fluid and fetal movement were reviewed in detail with the patient. Please refer to After Visit Summary for other counseling recommendations.  Return in about 4 weeks (around 07/30/2017).   Ames Coupeharles A McLendon, Medical Student  I confirm that I have verified the information documented in the medical student's note and that I have also personally reperformed the physical exam and all medical decision making activities.   Rolm BookbinderCaroline M Clyde Zarrella, CNM 07/02/17 1:19 PM

## 2017-07-02 NOTE — Patient Instructions (Signed)

## 2017-07-05 LAB — AFP TETRA
DIA Mom Value: 1.89
DIA Value (EIA): 444.56 pg/mL
DSR (BY AGE) 1 IN: 1124
DSR (Second Trimester) 1 IN: 2584
Gestational Age: 15 WEEKS
MSAFP MOM: 1.17
MSAFP: 42.6 ng/mL
MSHCG MOM: 1.87
MSHCG: 130507 m[IU]/mL
Maternal Age At EDD: 22.2 yr
Osb Risk: 10000
T18 (By Age): 1:4377 {titer}
TEST RESULTS AFP: NEGATIVE
UE3 VALUE: 0.89 ng/mL
Weight: 105 [lb_av]
uE3 Mom: 1.43

## 2017-07-23 ENCOUNTER — Other Ambulatory Visit: Payer: Self-pay

## 2017-07-23 ENCOUNTER — Ambulatory Visit (HOSPITAL_COMMUNITY)
Admission: RE | Admit: 2017-07-23 | Discharge: 2017-07-23 | Disposition: A | Discharge status: Home / Self Care | Payer: 59 | Source: Ambulatory Visit

## 2017-07-23 DIAGNOSIS — Z3A18 18 weeks gestation of pregnancy: Secondary | ICD-10-CM | POA: Diagnosis not present

## 2017-07-23 DIAGNOSIS — Z3480 Encounter for supervision of other normal pregnancy, unspecified trimester: Secondary | ICD-10-CM

## 2017-07-23 DIAGNOSIS — Z363 Encounter for antenatal screening for malformations: Secondary | ICD-10-CM

## 2017-07-23 DIAGNOSIS — Z3482 Encounter for supervision of other normal pregnancy, second trimester: Secondary | ICD-10-CM | POA: Diagnosis not present

## 2017-07-30 ENCOUNTER — Ambulatory Visit (INDEPENDENT_AMBULATORY_CARE_PROVIDER_SITE_OTHER): Payer: 59 | Admitting: Medical

## 2017-07-30 VITALS — BP 118/69 | HR 84 | Wt 113.6 lb

## 2017-07-30 DIAGNOSIS — IMO0002 Reserved for concepts with insufficient information to code with codable children: Secondary | ICD-10-CM

## 2017-07-30 DIAGNOSIS — Z0489 Encounter for examination and observation for other specified reasons: Secondary | ICD-10-CM

## 2017-07-30 DIAGNOSIS — Z3480 Encounter for supervision of other normal pregnancy, unspecified trimester: Secondary | ICD-10-CM

## 2017-07-30 NOTE — Patient Instructions (Addendum)
Second Trimester of Pregnancy The second trimester is from week 13 through week 28, month 4 through 6. This is often the time in pregnancy that you feel your best. Often times, morning sickness has lessened or quit. You may have more energy, and you may get hungry more often. Your unborn baby (fetus) is growing rapidly. At the end of the sixth month, he or she is about 9 inches long and weighs about 1 pounds. You will likely feel the baby move (quickening) between 18 and 20 weeks of pregnancy. Follow these instructions at home:  Avoid all smoking, herbs, and alcohol. Avoid drugs not approved by your doctor.  Do not use any tobacco products, including cigarettes, chewing tobacco, and electronic cigarettes. If you need help quitting, ask your doctor. You may get counseling or other support to help you quit.  Only take medicine as told by your doctor. Some medicines are safe and some are not during pregnancy.  Exercise only as told by your doctor. Stop exercising if you start having cramps.  Eat regular, healthy meals.  Wear a good support bra if your breasts are tender.  Do not use hot tubs, steam rooms, or saunas.  Wear your seat belt when driving.  Avoid raw meat, uncooked cheese, and liter boxes and soil used by cats.  Take your prenatal vitamins.  Take 1500-2000 milligrams of calcium daily starting at the 20th week of pregnancy until you deliver your baby.  Try taking medicine that helps you poop (stool softener) as needed, and if your doctor approves. Eat more fiber by eating fresh fruit, vegetables, and whole grains. Drink enough fluids to keep your pee (urine) clear or pale yellow.  Take warm water baths (sitz baths) to soothe pain or discomfort caused by hemorrhoids. Use hemorrhoid cream if your doctor approves.  If you have puffy, bulging veins (varicose veins), wear support hose. Raise (elevate) your feet for 15 minutes, 3-4 times a day. Limit salt in your diet.  Avoid heavy  lifting, wear low heals, and sit up straight.  Rest with your legs raised if you have leg cramps or low back pain.  Visit your dentist if you have not gone during your pregnancy. Use a soft toothbrush to brush your teeth. Be gentle when you floss.  You can have sex (intercourse) unless your doctor tells you not to.  Go to your doctor visits. Get help if:  You feel dizzy.  You have mild cramps or pressure in your lower belly (abdomen).  You have a nagging pain in your belly area.  You continue to feel sick to your stomach (nauseous), throw up (vomit), or have watery poop (diarrhea).  You have bad smelling fluid coming from your vagina.  You have pain with peeing (urination). Get help right away if:  You have a fever.  You are leaking fluid from your vagina.  You have spotting or bleeding from your vagina.  You have severe belly cramping or pain.  You lose or gain weight rapidly.  You have trouble catching your breath and have chest pain.  You notice sudden or extreme puffiness (swelling) of your face, hands, ankles, feet, or legs.  You have not felt the baby move in over an hour.  You have severe headaches that do not go away with medicine.  You have vision changes. This information is not intended to replace advice given to you by your health care provider. Make sure you discuss any questions you have with your health care  provider. Document Released: 08/28/2009 Document Revised: 11/09/2015 Document Reviewed: 08/04/2012 Elsevier Interactive Patient Education  2017 [place] Education Options: [facility] Department Classes:  Childbirth education classes can help you get ready for a positive parenting experience. You can also meet other expectant parents and get free stuff for your baby. Each class runs for five weeks on the same night and costs $45 for the mother-to-be and her support person. Medicaid covers the cost if you are eligible.  Call [phone] to register. [facility] Childbirth Education:  [phone] or [phone] or [email or web address]  Baby & Me Class: Discuss newborn & infant parenting and family adjustment issues with other new mothers in a relaxed environment. Each week brings a new speaker or baby-centered activity. We encourage new mothers to join [place] every Thursday at 11:00am. Babies birth until crawling. No registration or fee. Daddy [employer]: This course offers Dads-to-be the tools and knowledge needed to feel confident on their journey to becoming new fathers. Experienced dads, who have been trained as coaches, teach dads-to-be how to hold, comfort, diaper, swaddle and play with their infant while being able to support the new mom as well. A class for men taught by men. $25/dad Big Brother/Big Sister: Let your children share in the joy of a new brother or sister in this special class designed just for them. Class includes discussion about how families care for babies: swaddling, holding, diapering, safety as well as how they can be helpful in their new role. This class is designed for children ages 67 to 18, but any age is welcome. Please register each child individually. $5/child  Mom Talk: This mom-led group offers support and connection to mothers as they journey through the adjustments and struggles of that sometimes overwhelming first year after the birth of a child. Tuesdays at 10:00am and Thursdays at 6:00pm. Babies welcome. No registration or fee. Breastfeeding Support Group: This group is a mother-to-mother support circle where moms have the opportunity to share their breastfeeding experiences. A Lactation Consultant is present for questions and concerns. Meets each Tuesday at 11:00am. No fee or registration. Breastfeeding Your Baby: Learn what to expect in the first days of breastfeeding your newborn.  This class will help you feel more confident with the skills needed to begin your  breastfeeding experience. Many new mothers are concerned about breastfeeding after leaving the hospital. This class will also address the most common fears and challenges about breastfeeding during the first few weeks, months and beyond. (call for fee) Comfort Techniques and Tour: This 2 hour interactive class will provide you the opportunity to learn & practice hands-on techniques that can help relieve some of the discomfort of labor and encourage your baby to rotate toward the best position for birth. You and your partner will be able to try a variety of labor positions with birth balls and rebozos as well as practice breathing, relaxation, and visualization techniques. A tour of the [facility] is included with this class. $20 per registrant and support person Childbirth Class- Weekend Option: This class is a Weekend version of our Birth & Baby series. It is designed for parents who have a difficult time fitting several weeks of classes into their schedule. It covers the care of your newborn and the basics of labor and childbirth. It also includes a [facility] and lunch. The class is held two consecutive days: beginning on Friday evening from 6:30 - 8:30 p.m. and the  the next day, Saturday from 9 a.m. - 4 p.m. (call for fee) Waterbirth Class: Interested in a waterbirth?  This informational class will help you discover whether waterbirth is the right fit for you. Education about waterbirth itself, supplies you would need and how to assemble your support team is what you can expect from this class. Some obstetrical practices require this class in order to pursue a waterbirth. (Not all obstetrical practices offer waterbirth-check with your healthcare provider.) Register only the expectant mom, but you are encouraged to bring your partner to class! Required if planning waterbirth, no fee. Infant/Child CPR: Parents, grandparents, babysitters, and friends  learn Cardio-Pulmonary Resuscitation skills for infants and children. You will also learn how to treat both conscious and unconscious choking in infants and children. This Family & Friends program does not offer certification. Register each participant individually to ensure that enough mannequins are available. (Call for fee) Grandparent Love: Expecting a grandbaby? This class is for you! Learn about the latest infant care and safety recommendations and ways to support your own child as he or she transitions into the parenting role. Taught by Registered Nurses who are childbirth instructors, but most importantly...they are grandmothers too! $10/person. Childbirth Class- Natural Childbirth: This series of 5 weekly classes is for expectant parents who want to learn and practice natural methods of coping with the process of labor and childbirth. Relaxation, breathing, massage, visualization, role of the partner, and helpful positioning are highlighted. Participants learn how to be confident in their body's ability to give birth. This class will empower and help parents make informed decisions about their own care. Includes discussion that will help new parents transition into the immediate postpartum period. Maternity Care Center Tour of [facility] is included. We suggest taking this class between 25-32 weeks, but it's only a recommendation. $75 per registrant and one support person or $30 Medicaid. Childbirth Class- 3 week Series: This option of 3 weekly classes helps you and your labor partner prepare for childbirth. Newborn care, labor & birth, cesarean birth, pain management, and comfort techniques are discussed and a Maternity Care Center Tour of [facility] is included. The class meets at the same time, on the same day of the week for 3 consecutive weeks beginning with the starting date you choose. $60 for registrant and one support person.  Marvelous Multiples: Expecting twins, triplets, or more?  This class covers the differences in labor, birth, parenting, and breastfeeding issues that face multiples' parents. NICU tour is included. Led by a Certified Childbirth Educator who is the mother of twins. No fee. Caring for Baby: This class is for expectant and adoptive parents who want to learn and practice the most up-to-date newborn care for their babies. Focus is on birth through the first six weeks of life. Topics include feeding, bathing, diapering, crying, umbilical cord care, circumcision care and safe sleep. Parents learn to recognize symptoms of illness and when to call the pediatrician. Register only the mom-to-be and your partner or support person can plan to come with you! $10 per registrant and support person Childbirth Class- online option: This online class offers you the freedom to complete a Birth and Baby series in the comfort of your own home. The flexibility of this option allows you to review sections at your own pace, at times convenient to you and your support people. It includes additional video information, animations, quizzes, and extended activities. Get organized with helpful eClass tools, checklists, and trackers. Once you register online for the   you will receive an email within a few days to accept the invitation and begin the class when the time is right for you. The content will be available to you for 60 days. $60 for 60 days of online access for you and your support people.  Local Doulas: Natural Baby Doulas [email or web address] Tel: [phone] [email or web address] [employer] [phone] [email or web address] The Labor Hassell Halim  (also do waterbirth tub rental) [phone] [email or web address] [facility] Birth Doula [phone] [email or web address] NotebookDistributors.fi Sacred Rhythms  [phone] [email or web address]-[email or web address]/ [employer]  [employer] (PADA) [email or web address] [place] [facility]  [email or web address] Considering Waterbirth? Guide for patients at [facility] [employer]  Why consider waterbirth?  . Gentle birth for babies . Less pain medicine used in labor . May allow for passive descent/less pushing . May reduce perineal tears  . More mobility and instinctive maternal position changes . Increased maternal relaxation . Reduced blood pressure in labor  Is waterbirth safe? What are the risks of infection, drowning or other complications?  . Infection: o Very low risk (3.7 % for tub vs 4.8% for bed) o 7 in 8000 waterbirths with documented infection o Poorly cleaned equipment most common cause o Slightly lower group B strep transmission rate  . Drowning o Maternal:  - Very low risk   - Related to seizures or fainting o Newborn:  - Very low risk. No evidence of increased risk of respiratory problems in multiple large studies - Physiological protection from breathing under water - Avoid underwater birth if there are any fetal complications - Once baby's head is out of the water, keep it out.  . Birth complication o Some reports of cord trauma, but risk decreased by bringing baby to surface gradually o No evidence of increased risk of shoulder dystocia. Mothers can usually change positions faster in water than in a bed, possibly aiding the maneuvers to free the shoulder.   You must attend a Doren Custard class at [facility]  3rd Wednesday of every month from 7-9pm  [employer] by calling [phone] or online at [email or web address]  Bring [place] the certificate from the class to your prenatal appointment  Meet with a midwife at 36 weeks to see if you can still plan a waterbirth and to sign the consent.   Purchase or rent the following supplies:   Water Birth Pool (Birth Pool in a Box or [place] for instance)  (Tubs  start ~$125)  Single-use disposable tub liner designed for your brand of tub  New garden hose labeled "lead-free", "suitable for drinking water",  Electric drain pump to remove water (We recommend 792 gallon per hour or greater pump.)   Separate garden hose to remove the dirty water  Fish net  Bathing suit top (optional)  Long-handled mirror (optional)  Places to purchase or rent supplies  GotWebTools.is for tub purchases and supplies  Waterbirthsolutions.com for tub purchases and supplies  The Labor Ladies (www.[email or web address]) $275 for tub rental/set-up & take down/kit   [employer] (http://www.fleming.com/.htm) Information regarding doulas (labor support) who provide pool rentals  Our practice has a Birth Pool in a Box tub at the hospital that you may borrow on a first-come-first-served basis. It is your responsibility to to set up, clean and break down the tub. We cannot guarantee the availability of this tub in advance. You are responsible for bringing all accessories listed above. If you do not have all necessary supplies you cannot have a waterbirth.    Things that would prevent you from  from having a waterbirth:  Premature, <37wks  Previous cesarean birth  Presence of thick meconium-stained fluid  Multiple gestation (Twins, triplets, etc.)  Uncontrolled diabetes or gestational diabetes requiring medication  Hypertension requiring medication or diagnosis of pre-eclampsia  Heavy vaginal bleeding  Non-reassuring fetal heart rate  Active infection (MRSA, etc.). Group B Strep is NOT a contraindication for  waterbirth.  If your labor has to be induced and induction method requires continuous  monitoring of the baby's heart rate  Other risks/issues identified by your obstetrical provider  Please remember that birth is unpredictable. Under certain unforeseeable circumstances your provider may advise against giving birth in the tub. These  decisions will be made on a case-by-case basis and with the safety of you and your baby as our highest priority.      

## 2017-07-30 NOTE — Progress Notes (Signed)
   PRENATAL VISIT NOTE  Subjective:  Laurie Castro is a 22 y.o. G2P0010 at 3643w6d being seen today for ongoing prenatal care.  She is currently monitored for the following issues for this low-risk pregnancy and has Supervision of other normal pregnancy, antepartum on their problem list.  Patient reports occasional cramping.  Contractions: Not present. Vag. Bleeding: None.  Movement: Present. Denies leaking of fluid.   The following portions of the patient's history were reviewed and updated as appropriate: allergies, current medications, past family history, past medical history, past social history, past surgical history and problem list. Problem list updated.  Objective:   Vitals:   07/30/17 1120  BP: 118/69  Pulse: 84  Weight: 113 lb 9.6 oz (51.5 kg)    Fetal Status: Fetal Heart Rate (bpm): 142 Fundal Height: 19 cm Movement: Present     General:  Alert, oriented and cooperative. Patient is in no acute distress.  Skin: Skin is warm and dry. No rash noted.   Cardiovascular: Normal heart rate noted  Respiratory: Normal respiratory effort, no problems with respiration noted  Abdomen: Soft, gravid, appropriate for gestational age.  Pain/Pressure: Present     Pelvic: Cervical exam deferred        Extremities: Normal range of motion.  Edema: None  Mental Status:  Normal mood and affect. Normal behavior. Normal judgment and thought content.   Assessment and Plan:  Pregnancy: G2P0010 at 5743w6d  1. Supervision of other normal pregnancy, antepartum - Doing well, occasional cramping  - Encouraged increased hydration and abdominal binder  2. Evaluate anatomy not seen on prior sonogram - US MFM OB FOLLOW UP; scheduled 08/27/17  Preterm labor symptoms and general obstetric precautions including but not limited to vaginal bleeding, contractions, leaking of fluid and fetal movement were reviewed in detail with the patient. Please refer to After Visit Summary for other counseling  recommendations.  Return in about 4 weeks (around 08/27/2017) for LOB.   Vonzella NippleJulie Julyssa Kyer, PA-C

## 2017-08-18 ENCOUNTER — Telehealth: Payer: Self-pay | Admitting: *Deleted

## 2017-08-18 NOTE — Telephone Encounter (Signed)
Laurie Castro called 08/15/17 pm and left a message she would like a nurse to call her - is [redacted] weeks pregnant and is having a lot of bleeding gums and vomitng.    I called Laurie Castro and she states her nausea and vomiting is not as bad today, was having some and then had gotten a lot better but then for about 3 days got worse again, and now not bad again. I offered medication for nausea which she declined. We discussed staying away from spicy foods,, fried foods and anything that makes you throw up- listen to her body. Advised if worsens and can't keep food down- to come to mau.  Discussed she has been having bleeding gums and just wants to know what she can do.  She is already using salt water rinses . She has not been to a dentist in a year - I advised her to make an appointment and we dscussed pregnancy can cause dental issues. She is using a soft toothbrush.  I advised her to call back if anything worsens, etc. She has appt next week.

## 2017-08-27 ENCOUNTER — Ambulatory Visit (INDEPENDENT_AMBULATORY_CARE_PROVIDER_SITE_OTHER): Payer: 59 | Admitting: Medical

## 2017-08-27 ENCOUNTER — Ambulatory Visit (HOSPITAL_COMMUNITY)
Admission: RE | Admit: 2017-08-27 | Discharge: 2017-08-27 | Disposition: A | Discharge status: Home / Self Care | Payer: 59 | Source: Ambulatory Visit | Attending: Medical | Admitting: Medical

## 2017-08-27 ENCOUNTER — Ambulatory Visit (HOSPITAL_COMMUNITY): Payer: 59

## 2017-08-27 ENCOUNTER — Encounter: Payer: Self-pay | Admitting: Medical

## 2017-08-27 VITALS — BP 109/68 | HR 84 | Wt 117.7 lb

## 2017-08-27 DIAGNOSIS — Z0489 Encounter for examination and observation for other specified reasons: Secondary | ICD-10-CM

## 2017-08-27 DIAGNOSIS — Z362 Encounter for other antenatal screening follow-up: Secondary | ICD-10-CM | POA: Diagnosis not present

## 2017-08-27 DIAGNOSIS — Z3A23 23 weeks gestation of pregnancy: Secondary | ICD-10-CM | POA: Insufficient documentation

## 2017-08-27 DIAGNOSIS — Z3480 Encounter for supervision of other normal pregnancy, unspecified trimester: Secondary | ICD-10-CM

## 2017-08-27 DIAGNOSIS — IMO0002 Reserved for concepts with insufficient information to code with codable children: Secondary | ICD-10-CM

## 2017-08-27 NOTE — Patient Instructions (Addendum)
Second Trimester of Pregnancy The second trimester is from week 13 through week 28, month 4 through 6. This is often the time in pregnancy that you feel your best. Often times, morning sickness has lessened or quit. You may have more energy, and you may get hungry more often. Your unborn baby (fetus) is growing rapidly. At the end of the sixth month, he or she is about 9 inches long and weighs about 1 pounds. You will likely feel the baby move (quickening) between 18 and 20 weeks of pregnancy. Follow these instructions at home:  Avoid all smoking, herbs, and alcohol. Avoid drugs not approved by your doctor.  Do not use any tobacco products, including cigarettes, chewing tobacco, and electronic cigarettes. If you need help quitting, ask your doctor. You may get counseling or other support to help you quit.  Only take medicine as told by your doctor. Some medicines are safe and some are not during pregnancy.  Exercise only as told by your doctor. Stop exercising if you start having cramps.  Eat regular, healthy meals.  Wear a good support bra if your breasts are tender.  Do not use hot tubs, steam rooms, or saunas.  Wear your seat belt when driving.  Avoid raw meat, uncooked cheese, and liter boxes and soil used by cats.  Take your prenatal vitamins.  Take 1500-2000 milligrams of calcium daily starting at the 20th week of pregnancy until you deliver your baby.  Try taking medicine that helps you poop (stool softener) as needed, and if your doctor approves. Eat more fiber by eating fresh fruit, vegetables, and whole grains. Drink enough fluids to keep your pee (urine) clear or pale yellow.  Take warm water baths (sitz baths) to soothe pain or discomfort caused by hemorrhoids. Use hemorrhoid cream if your doctor approves.  If you have puffy, bulging veins (varicose veins), wear support hose. Raise (elevate) your feet for 15 minutes, 3-4 times a day. Limit salt in your diet.  Avoid heavy  lifting, wear low heals, and sit up straight.  Rest with your legs raised if you have leg cramps or low back pain.  Visit your dentist if you have not gone during your pregnancy. Use a soft toothbrush to brush your teeth. Be gentle when you floss.  You can have sex (intercourse) unless your doctor tells you not to.  Go to your doctor visits. Get help if:  You feel dizzy.  You have mild cramps or pressure in your lower belly (abdomen).  You have a nagging pain in your belly area.  You continue to feel sick to your stomach (nauseous), throw up (vomit), or have watery poop (diarrhea).  You have bad smelling fluid coming from your vagina.  You have pain with peeing (urination). Get help right away if:  You have a fever.  You are leaking fluid from your vagina.  You have spotting or bleeding from your vagina.  You have severe belly cramping or pain.  You lose or gain weight rapidly.  You have trouble catching your breath and have chest pain.  You notice sudden or extreme puffiness (swelling) of your face, hands, ankles, feet, or legs.  You have not felt the baby move in over an hour.  You have severe headaches that do not go away with medicine.  You have vision changes. This information is not intended to replace advice given to you by your health care provider. Make sure you discuss any questions you have with your health care   provider. Document Released: 08/28/2009 Document Revised: 11/09/2015 Document Reviewed: 08/04/2012 Elsevier Interactive Patient Education  2017 [place] [address] [place], [place]  [address] Phone - [phone]   Fax - [phone]  [facility] [address] [place], [place] [address] Phone - [phone]   Fax - [place] [address] [place], [place]  [address] Phone - [phone]   Fax -  [phone]  [place] [address] [place] [address] [place], [place]  [address] Phone - [phone]   Fax - [phone]  [place] [address] [place], [place]  [address] Phone - [phone]   Fax - [phone]  [facility] [address] [place], [place]  [address] Phone - [phone]   Fax - [place] [address], [place], [place]  [address] Phone - [phone]   Fax - [phone]  [place] [address] [place], [place] [address] [place], [place]  [address] Phone - [phone]   Fax - [place] [address] [place], [place]  [address] Phone - [phone]   Fax - [phone] [facility] [place]. [address] [place], [place]  [address] Phone - [phone]   Fax - [phone]  [facility] [place] [address] [place], [place]  [address] Phone - [phone]   Fax - [phone]  [facility] [place], [place], [place]  [address] Phone - [phone]   Fax - [place] [address] [place], [place]  [address] Phone - [phone]   Fax - [phone]  [facility] [address], [place], [place]  [address] Phone - [place], [place]  [address] Phone - [phone]   Fax - [place] [address] [place], [place]  [address] Phone - [phone]   Fax - [phone]  [place] [address] [place], [place]  [address] Phone - [phone]   Fax - [place]. [place], [place]  [address] Phone - [phone]   Fax - [place], [place], [place]   [address] Phone - [phone]   Fax - [place] [address] [place], [place]  [address] Phone - [phone]   Fax - [phone]  [facility] [address] [place], [place]  [address] Phone - [phone]   Fax - [place] [address], [place], [place]  [address] Phone - [phone]   Fax - [phone]  [place] [address] [place], [place]  [address] Phone - [phone]   Fax - [phone] [place] [address] [place], [place]  [address] Phone - [phone]   Fax - [place] [address] [place],   [address] Phone - [phone]   Fax - [place] [address], [place],   [address] Phone - [phone]   Fax - [phone]  [place] [address] [place] [address] Phone  [phone]  Fax [phone]

## 2017-08-27 NOTE — Progress Notes (Signed)
   PRENATAL VISIT NOTE  Subjective:  Laurie Castro is a 22 y.o. G2P0010 at 3248w6d being seen today for ongoing prenatal care.  She is currently monitored for the following issues for this low-risk pregnancy and has Supervision of other normal pregnancy, antepartum on their problem list.  Patient reports occasional lower abdominal pain with walking.  Contractions: Not present. Vag. Bleeding: None.  Movement: Present. Denies leaking of fluid.   The following portions of the patient's history were reviewed and updated as appropriate: allergies, current medications, past family history, past medical history, past social history, past surgical history and problem list. Problem list updated.  Objective:   Vitals:   08/27/17 0906  BP: 109/68  Pulse: 84  Weight: 117 lb 11.2 oz (53.4 kg)    Fetal Status: Fetal Heart Rate (bpm): 141 Fundal Height: 23 cm Movement: Present     General:  Alert, oriented and cooperative. Patient is in no acute distress.  Skin: Skin is warm and dry. No rash noted.   Cardiovascular: Normal heart rate noted  Respiratory: Normal respiratory effort, no problems with respiration noted  Abdomen: Soft, gravid, appropriate for gestational age.  Pain/Pressure: Present     Pelvic: Cervical exam deferred        Extremities: Normal range of motion.  Edema: None  Mental Status:  Normal mood and affect. Normal behavior. Normal judgment and thought content.   Assessment and Plan:  Pregnancy: G2P0010 at 5848w6d  1. Supervision of other normal pregnancy, antepartum - Doing well - Encouraged abdominal binder use consistently with walking/standing - Tylenol PRN  - Warm bath or shower for pain   Preterm labor symptoms and general obstetric precautions including but not limited to vaginal bleeding, contractions, leaking of fluid and fetal movement were reviewed in detail with the patient. Please refer to After Visit Summary for other counseling recommendations.  Return in about  4 weeks (around 09/24/2017) for LOB, 28 week labs (fasting).   Vonzella NippleJulie Kyrah Schiro, PA-C

## 2017-09-23 ENCOUNTER — Other Ambulatory Visit: Payer: Self-pay | Admitting: *Deleted

## 2017-09-23 DIAGNOSIS — Z3493 Encounter for supervision of normal pregnancy, unspecified, third trimester: Secondary | ICD-10-CM

## 2017-09-24 ENCOUNTER — Other Ambulatory Visit: Payer: 59

## 2017-09-24 ENCOUNTER — Ambulatory Visit (INDEPENDENT_AMBULATORY_CARE_PROVIDER_SITE_OTHER): Payer: 59

## 2017-09-24 VITALS — BP 97/61 | HR 77 | Wt 120.6 lb

## 2017-09-24 DIAGNOSIS — Z3480 Encounter for supervision of other normal pregnancy, unspecified trimester: Secondary | ICD-10-CM

## 2017-09-24 DIAGNOSIS — Z3482 Encounter for supervision of other normal pregnancy, second trimester: Secondary | ICD-10-CM

## 2017-09-24 DIAGNOSIS — Z3493 Encounter for supervision of normal pregnancy, unspecified, third trimester: Secondary | ICD-10-CM

## 2017-09-24 NOTE — Patient Instructions (Addendum)
Contraception Choices Contraception, also called birth control, refers to methods or devices that prevent pregnancy. Hormonal methods Contraceptive implant A contraceptive implant is a thin, plastic tube that contains a hormone. It is inserted into the upper part of the arm. It can remain in place for up to 3 years. Progestin-only injections Progestin-only injections are injections of progestin, a synthetic form of the hormone progesterone. They are given every 3 months by a health care provider. Birth control pills Birth control pills are pills that contain hormones that prevent pregnancy. They must be taken once a day, preferably at the same time each day. Birth control patch The birth control patch contains hormones that prevent pregnancy. It is placed on the skin and must be changed once a week for three weeks and removed on the fourth week. A prescription is needed to use this method of contraception. Vaginal ring A vaginal ring contains hormones that prevent pregnancy. It is placed in the vagina for three weeks and removed on the fourth week. After that, the process is repeated with a new ring. A prescription is needed to use this method of contraception. Emergency contraceptive Emergency contraceptives prevent pregnancy after unprotected sex. They come in pill form and can be taken up to 5 days after sex. They work best the sooner they are taken after having sex. Most emergency contraceptives are available without a prescription. This method should not be used as your only form of birth control. Barrier methods Female condom A female condom is a thin sheath that is worn over the penis during sex. Condoms keep sperm from going inside a woman's body. They can be used with a spermicide to increase their effectiveness. They should be disposed after a single use. Female condom A female condom is a soft, loose-fitting sheath that is put into the vagina before sex. The condom keeps sperm from going  inside a woman's body. They should be disposed after a single use. Diaphragm A diaphragm is a soft, dome-shaped barrier. It is inserted into the vagina before sex, along with a spermicide. The diaphragm blocks sperm from entering the uterus, and the spermicide kills sperm. A diaphragm should be left in the vagina for 6-8 hours after sex and removed within 24 hours. A diaphragm is prescribed and fitted by a health care provider. A diaphragm should be replaced every 1-2 years, after giving birth, after gaining more than 15 lb (6.8 kg), and after pelvic surgery. Cervical cap A cervical cap is a round, soft latex or plastic cup that fits over the cervix. It is inserted into the vagina before sex, along with spermicide. It blocks sperm from entering the uterus. The cap should be left in place for 6-8 hours after sex and removed within 48 hours. A cervical cap must be prescribed and fitted by a health care provider. It should be replaced every 2 years. Sponge A sponge is a soft, circular piece of polyurethane foam with spermicide on it. The sponge helps block sperm from entering the uterus, and the spermicide kills sperm. To use it, you make it wet and then insert it into the vagina. It should be inserted before sex, left in for at least 6 hours after sex, and removed and thrown away within 30 hours. Spermicides Spermicides are chemicals that kill or block sperm from entering the cervix and uterus. They can come as a cream, jelly, suppository, foam, or tablet. A spermicide should be inserted into the vagina with an applicator at least 10-15 minutes before   sex to allow time for it to work. The process must be repeated every time you have sex. Spermicides do not require a prescription. Intrauterine contraception Intrauterine device (IUD) An IUD is a T-shaped device that is put in a woman's uterus. There are two types:  Hormone IUD.This type contains progestin, a synthetic form of the hormone progesterone. This  type can stay in place for 3-5 years.  Copper IUD.This type is wrapped in copper wire. It can stay in place for 10 years.  Permanent methods of contraception Female tubal ligation In this method, a woman's fallopian tubes are sealed, tied, or blocked during surgery to prevent eggs from traveling to the uterus. Hysteroscopic sterilization In this method, a small, flexible insert is placed into each fallopian tube. The inserts cause scar tissue to form in the fallopian tubes and block them, so sperm cannot reach an egg. The procedure takes about 3 months to be effective. Another form of birth control must be used during those 3 months. Female sterilization This is a procedure to tie off the tubes that carry sperm (vasectomy). After the procedure, the man can still ejaculate fluid (semen). Natural planning methods Natural family planning In this method, a couple does not have sex on days when the woman could become pregnant. Calendar method This means keeping track of the length of each menstrual cycle, identifying the days when pregnancy can happen, and not having sex on those days. Ovulation method In this method, a couple avoids sex during ovulation. Symptothermal method This method involves not having sex during ovulation. The woman typically checks for ovulation by watching changes in her temperature and in the consistency of cervical mucus. Post-ovulation method In this method, a couple waits to have sex until after ovulation. Summary  Contraception, also called birth control, means methods or devices that prevent pregnancy.  Hormonal methods of contraception include implants, injections, pills, patches, vaginal rings, and emergency contraceptives.  Barrier methods of contraception can include female condoms, female condoms, diaphragms, cervical caps, sponges, and spermicides.  There are two types of IUDs (intrauterine devices). An IUD can be put in a woman's uterus to prevent pregnancy  for 3-5 years.  Permanent sterilization can be done through a procedure for males, females, or both.  Natural family planning methods involve not having sex on days when the woman could become pregnant. This information is not intended to replace advice given to you by your health care provider. Make sure you discuss any questions you have with your health care provider. Document Released: 06/03/2005 Document Revised: 07/06/2016 Document Reviewed: 07/06/2016 Elsevier Interactive Patient Education  2018 [employer].   Safe Medications in Pregnancy   Acne: Benzoyl Peroxide Salicylic Acid  Backache/Headache: Tylenol: 2 regular strength every 4 hours OR              2 Extra strength every 6 hours  Colds/Coughs/Allergies: Benadryl (alcohol free) 25 mg every 6 hours as needed Breath right strips Claritin Cepacol throat lozenges Chloraseptic throat spray Cold-Eeze- up to three times per day Cough drops, alcohol free Flonase (by prescription only) Guaifenesin Mucinex Robitussin DM (plain only, alcohol free) Saline nasal spray/drops Sudafed (pseudoephedrine) & Actifed ** use only after [redacted] weeks gestation and if you do not have high blood pressure Tylenol Vicks Vaporub Zinc lozenges Zyrtec   Constipation: Colace Ducolax suppositories Fleet enema Glycerin suppositories Metamucil Milk of magnesia Miralax Senokot Smooth move tea  Diarrhea: Kaopectate Imodium A-D  *NO pepto Bismol  Hemorrhoids: Anusol Anusol HC Preparation H  Tucks  Indigestion: Tums Maalox Mylanta Zantac  Pepcid  Insomnia: Benadryl (alcohol free) 25mg  every 6 hours as needed Tylenol PM Unisom, no Gelcaps  Leg Cramps: Tums MagGel  Nausea/Vomiting:  Bonine Dramamine Emetrol Ginger extract Sea bands Meclizine  Nausea medication to take during pregnancy:  Unisom (doxylamine succinate 25 mg tablets) Take one tablet daily at bedtime. If symptoms are not adequately controlled, the  dose can be increased to a maximum recommended dose of two tablets daily (1/2 tablet in the morning, 1/2 tablet mid-afternoon and one at bedtime). Vitamin B6 100mg  tablets. Take one tablet twice a day (up to 200 mg per day).  Skin Rashes: Aveeno products Benadryl cream or 25mg  every 6 hours as needed Calamine Lotion 1% cortisone cream  Yeast infection: Gyne-lotrimin 7 Monistat 7   **If taking multiple medications, please check labels to avoid duplicating the same active ingredients **take medication as directed on the label ** Do not exceed 4000 mg of tylenol in 24 hours **Do not take medications that contain aspirin or ibuprofen    Tdap Vaccine (Tetanus, Diphtheria and Pertussis): What You Need to Know 1. Why get vaccinated? Tetanus, diphtheria and pertussis are very serious diseases. Tdap vaccine can protect [place] from these diseases. And, Tdap vaccine given to pregnant women can protect newborn babies against pertussis. TETANUS (Lockjaw) is rare in the [place] today. It causes painful muscle tightening and stiffness, usually all over the body.  It can lead to tightening of muscles in the head and neck so you can't open your mouth, swallow, or sometimes even breathe. Tetanus kills about 1 out of 10 people who are infected even after receiving the best medical care.  DIPHTHERIA is also rare in the [place] today. It can cause a thick coating to form in the back of the throat.  It can lead to breathing problems, heart failure, paralysis, and death.  PERTUSSIS (Whooping Cough) causes severe coughing spells, which can cause difficulty breathing, vomiting and disturbed sleep.  It can also lead to weight loss, incontinence, and rib fractures. Up to 2 in 100 adolescents and 5 in 100 adults with pertussis are hospitalized or have complications, which could include pneumonia or death.  These diseases are caused by bacteria. Diphtheria and pertussis are spread from person to  person through secretions from coughing or sneezing. Tetanus enters the body through cuts, scratches, or wounds. Before vaccines, as many as 200,000 cases of diphtheria, 200,000 cases of pertussis, and hundreds of cases of tetanus, were reported in the [place] each year. Since vaccination began, reports of cases for tetanus and diphtheria have dropped by about 99% and for pertussis by about 80%. 2. Tdap vaccine Tdap vaccine can protect adolescents and adults from tetanus, diphtheria, and pertussis. One dose of Tdap is routinely given at age 30 or 59. People who did not get Tdap at that age should get it as soon as possible. Tdap is especially important for healthcare professionals and anyone having close contact with a baby younger than 12 months. Pregnant women should get a dose of Tdap during every pregnancy, to protect the newborn from pertussis. Infants are most at risk for severe, life-threatening complications from pertussis. Another vaccine, called Td, protects against tetanus and diphtheria, but not pertussis. A Td booster should be given every 10 years. Tdap may be given as one of these boosters if you have never gotten Tdap before. Tdap may also be given after a severe cut or burn to prevent tetanus infection.  Your doctor or the person giving you the vaccine can give you more information. Tdap may safely be given at the same time as other vaccines. 3. Some people should not get this vaccine  A person who has ever had a life-threatening allergic reaction after a previous dose of any diphtheria, tetanus or pertussis containing vaccine, OR has a severe allergy to any part of this vaccine, should not get Tdap vaccine. Tell the person giving the vaccine about any severe allergies.  Anyone who had coma or long repeated seizures within 7 days after a childhood dose of DTP or DTaP, or a previous dose of Tdap, should not get Tdap, unless a cause other than the vaccine was found. They can still  get Td.  Talk to your doctor if you: ? have seizures or another nervous system problem, ? had severe pain or swelling after any vaccine containing diphtheria, tetanus or pertussis, ? ever had a condition called Guillain-Barr Syndrome (GBS), ? aren't feeling well on the day the shot is scheduled. 4. Risks With any medicine, including vaccines, there is a chance of side effects. These are usually mild and go away on their own. Serious reactions are also possible but are rare. Most people who get Tdap vaccine do not have any problems with it. Mild problems following Tdap: (Did not interfere with activities)  Pain where the shot was given (about 3 in 4 adolescents or 2 in 3 adults)  Redness or swelling where the shot was given (about 1 person in 5)  Mild fever of at least 100.20F (up to about 1 in 25 adolescents or 1 in 100 adults)  Headache (about 3 or 4 people in 10)  Tiredness (about 1 person in 3 or 4)  Nausea, vomiting, diarrhea, stomach ache (up to 1 in 4 adolescents or 1 in 10 adults)  Chills, sore joints (about 1 person in 10)  Body aches (about 1 person in 3 or 4)  Rash, swollen glands (uncommon)  Moderate problems following Tdap: (Interfered with activities, but did not require medical attention)  Pain where the shot was given (up to 1 in 5 or 6)  Redness or swelling where the shot was given (up to about 1 in 16 adolescents or 1 in 12 adults)  Fever over 102F (about 1 in 100 adolescents or 1 in 250 adults)  Headache (about 1 in 7 adolescents or 1 in 10 adults)  Nausea, vomiting, diarrhea, stomach ache (up to 1 or 3 people in 100)  Swelling of the entire arm where the shot was given (up to about 1 in 500).  Severe problems following Tdap: (Unable to perform usual activities; required medical attention)  Swelling, severe pain, bleeding and redness in the arm where the shot was given (rare).  Problems that could happen after any vaccine:  People sometimes  faint after a medical procedure, including vaccination. Sitting or lying down for about 15 minutes can help prevent fainting, and injuries caused by a fall. Tell your doctor if you feel dizzy, or have vision changes or ringing in the ears.  Some people get severe pain in the shoulder and have difficulty moving the arm where a shot was given. This happens very rarely.  Any medication can cause a severe allergic reaction. Such reactions from a vaccine are very rare, estimated at fewer than 1 in a million doses, and would happen within a few minutes to a few hours after the vaccination. As with any medicine, there is a very  remote chance of a vaccine causing a serious injury or death. The safety of vaccines is always being monitored. For more information, visit: http://floyd.org/www.cdc.gov/vaccinesafety/ 5. What if there is a serious problem? What should I look for? Look for anything that concerns you, such as signs of a severe allergic reaction, very high fever, or unusual behavior. Signs of a severe allergic reaction can include hives, swelling of the face and throat, difficulty breathing, a fast heartbeat, dizziness, and weakness. These would usually start a few minutes to a few hours after the vaccination. What should I do?  If you think it is a severe allergic reaction or other emergency that can't wait, call 9-1-1 or get the person to the nearest hospital. Otherwise, call your doctor.  Afterward, the reaction should be reported to the Vaccine Adverse Event Reporting System (VAERS). Your doctor might file this report, or you can do it yourself through the VAERS web site at www.vaers.LAgents.nohhs.gov, or by calling 1-256-322-2495. ? VAERS does not give medical advice. 6. The National Vaccine Injury Compensation Program The [employer] Vaccine Injury Compensation Program (VICP) is a federal program that was created to compensate people who may have been injured by certain vaccines. Persons who believe they may have been  injured by a vaccine can learn about the program and about filing a claim by calling 1-(743)388-9845 or visiting the VICP website at SpiritualWord.atwww.hrsa.gov/vaccinecompensation. There is a time limit to file a claim for compensation. 7. How can I learn more?  Ask your doctor. He or she can give you the vaccine package insert or suggest other sources of information.  Call your local or state health department.  Contact the Centers for Disease Control and Prevention (CDC): ? Call 636-638-28891-239-834-4943 (1-800-CDC-INFO) or ? Visit CDC's website at PicCapture.uywww.cdc.gov/vaccines CDC Tdap Vaccine VIS (08/10/13) This information is not intended to replace advice given to you by your health care provider. Make sure you discuss any questions you have with your health care provider. Document Released: 12/03/2011 Document Revised: 02/22/2016 Document Reviewed: 02/22/2016 Elsevier Interactive Patient Education  2017 [employer]

## 2017-09-24 NOTE — Progress Notes (Signed)
   PRENATAL VISIT NOTE  Subjective:  Laurie Castro is a 22 y.o. G2P0010 at 7483w6d being seen today for ongoing prenatal care.  She is currently monitored for the following issues for this low-risk pregnancy and has Supervision of other normal pregnancy, antepartum on their problem list.  Patient reports no complaints.  Contractions: Not present. Vag. Bleeding: None.  Movement: Present. Denies leaking of fluid.   The following portions of the patient's history were reviewed and updated as appropriate: allergies, current medications, past family history, past medical history, past social history, past surgical history and problem list. Problem list updated.  Objective:   Vitals:   09/24/17 0835  BP: 97/61  Pulse: 77  Weight: 120 lb 9.6 oz (54.7 kg)    Fetal Status: Fetal Heart Rate (bpm): 142 Fundal Height: 28 cm Movement: Present     General:  Alert, oriented and cooperative. Patient is in no acute distress.  Skin: Skin is warm and dry. No rash noted.   Cardiovascular: Normal heart rate noted  Respiratory: Normal respiratory effort, no problems with respiration noted  Abdomen: Soft, gravid, appropriate for gestational age.  Pain/Pressure: Present     Pelvic: Cervical exam deferred        Extremities: Normal range of motion.  Edema: None  Mental Status: Normal mood and affect. Normal behavior. Normal judgment and thought content.   Assessment and Plan:  Pregnancy: G2P0010 at 6883w6d  1. Supervision of other normal pregnancy, antepartum - Safe meds in pregnancy list reviewed - 2hr GTT and routine labs today. - Discussed Tdap risks and benefits, will review information and decide next visit  Preterm labor symptoms and general obstetric precautions including but not limited to vaginal bleeding, contractions, leaking of fluid and fetal movement were reviewed in detail with the patient. Please refer to After Visit Summary for other counseling recommendations.  Return in about 2 weeks  (around 10/08/2017) for Return OB visit.  Rolm BookbinderCaroline M Neill, CNM 09/24/17 8:52 AM

## 2017-09-25 LAB — CBC
HEMATOCRIT: 34.2 % (ref 34.0–46.6)
Hemoglobin: 11.8 g/dL (ref 11.1–15.9)
MCH: 33.1 pg — ABNORMAL HIGH (ref 26.6–33.0)
MCHC: 34.5 g/dL (ref 31.5–35.7)
MCV: 96 fL (ref 79–97)
Platelets: 167 10*3/uL (ref 150–379)
RBC: 3.57 x10E6/uL — AB (ref 3.77–5.28)
RDW: 13.7 % (ref 12.3–15.4)
WBC: 9.7 10*3/uL (ref 3.4–10.8)

## 2017-09-25 LAB — RPR: RPR: NONREACTIVE

## 2017-09-25 LAB — GLUCOSE TOLERANCE, 2 HOURS W/ 1HR
GLUCOSE, 1 HOUR: 100 mg/dL (ref 65–179)
GLUCOSE, 2 HOUR: 78 mg/dL (ref 65–152)
GLUCOSE, FASTING: 65 mg/dL (ref 65–91)

## 2017-09-25 LAB — HIV ANTIBODY (ROUTINE TESTING W REFLEX): HIV SCREEN 4TH GENERATION: NONREACTIVE

## 2017-10-13 ENCOUNTER — Telehealth: Payer: Self-pay | Admitting: General Practice

## 2017-10-13 NOTE — Telephone Encounter (Signed)
Patient called and left message on nurse voicemail line stating she is having cramps that feel like her period. Patient states she is [redacted] weeks pregnant and the pain seems to be getting worse. Called patient and she states she is feeling better compared to the weekend. Discussed braxton hicks contractions with patient versus labor contractions and reviewed reasons to go to [facility]. Patient verbalized understanding to all & had no questions.

## 2017-10-22 ENCOUNTER — Other Ambulatory Visit (HOSPITAL_COMMUNITY)
Admission: RE | Admit: 2017-10-22 | Discharge: 2017-10-22 | Disposition: A | Discharge status: Home / Self Care | Payer: 59 | Source: Ambulatory Visit | Attending: Nurse Practitioner | Admitting: Nurse Practitioner

## 2017-10-22 ENCOUNTER — Ambulatory Visit (INDEPENDENT_AMBULATORY_CARE_PROVIDER_SITE_OTHER): Payer: 59 | Admitting: Nurse Practitioner

## 2017-10-22 DIAGNOSIS — Z3480 Encounter for supervision of other normal pregnancy, unspecified trimester: Secondary | ICD-10-CM | POA: Insufficient documentation

## 2017-10-22 NOTE — Progress Notes (Addendum)
    Subjective:  Laurie Castro is a 22 y.o. G2P0010 at [redacted]w[redacted]d being seen today for ongoing prenatal care.  She is currently monitored for the following issues for this low-risk pregnancy and has Supervision of other normal pregnancy, antepartum on their problem list.  Patient reports reports leaking of fluid for 3 weeks.  States it is not vaginal discharge as it is clear in color.  Denies having any fever.  Contractions: Irritability. Vag. Bleeding: None.  Movement: Present.   The following portions of the patient's history were reviewed and updated as appropriate: allergies, current medications, past family history, past medical history, past social history, past surgical history and problem list. Problem list updated.  Objective:   Vitals:   10/22/17 0932  BP: 104/64  Pulse: (!) 103  Weight: 123 lb 1.6 oz (55.8 kg)    Fetal Status: Fetal Heart Rate (bpm): 153 Fundal Height: 31 cm Movement: Present     General:  Alert, oriented and cooperative. Patient is in no acute distress.  Skin: Skin is warm and dry. No rash noted.   Cardiovascular: Normal heart rate noted  Respiratory: Normal respiratory effort, no problems with respiration noted  Abdomen: Soft, gravid, appropriate for gestational age. Pain/Pressure: Present     Pelvic:  Cervical exam performed      Small amount of white asymptomatic discahrge seen,  Cervix appears closed.  No leaking of fluid with valsalva.  No pooling in vagina.  Extremities: Normal range of motion.  Edema: None  Mental Status: Normal mood and affect. Normal behavior. Normal judgment and thought content.   Urinalysis:      Assessment and Plan:  Pregnancy: G2P0010 at [redacted]w[redacted]d  1. Supervision of other normal pregnancy, antepartum No evidence of rupture of membranes.  Will watch labs for infection  Discussed TDAP and written info given to client as TDAP is recommended in pregnancy,.  - Cervicovaginal ancillary only  Preterm labor symptoms and general  obstetric precautions including but not limited to vaginal bleeding, contractions, leaking of fluid and fetal movement were reviewed in detail with the patient. Please refer to After Visit Summary for other counseling recommendations.  Return in about 2 weeks (around 11/05/2017).  Nolene Bernheim, RN, MSN, NP-BC Nurse Practitioner, [facility] for [employer], [facility] 10/22/2017 12:29 PM  Yeast infection found on testing done during vaginal exam.  Prescribed Terazol vaginal cream and sent a MyChart message to client.   Nolene Bernheim, RN, MSN, NP-BC Nurse Practitioner, [facility] for [employer], [facility] 10/23/2017 5:40 PM

## 2017-10-22 NOTE — Patient Instructions (Signed)

## 2017-10-23 ENCOUNTER — Other Ambulatory Visit: Payer: Self-pay | Admitting: Nurse Practitioner

## 2017-10-23 LAB — CERVICOVAGINAL ANCILLARY ONLY
BACTERIAL VAGINITIS: NEGATIVE
CANDIDA VAGINITIS: POSITIVE — AB
Chlamydia: NEGATIVE
NEISSERIA GONORRHEA: NEGATIVE
TRICH (WINDOWPATH): NEGATIVE

## 2017-10-23 MED ORDER — TERCONAZOLE 0.4 % VA CREA: 1.0000 | TOPICAL_CREAM | Freq: Every day | VAGINAL | 0 refills | Status: DC

## 2017-10-23 NOTE — Addendum Note (Signed)
Addended by: Currie Paris on: 10/23/2017 05:41 PM   Modules accepted: Orders

## 2017-10-23 NOTE — Progress Notes (Signed)
Added supervising MD to Terazol prescribed to client's pharmacy.  Nolene Bernheim, RN, MSN, NP-BC Nurse Practitioner, [facility] for [employer], [facility] 10/23/2017 5:57 PM

## 2017-11-05 ENCOUNTER — Encounter: Payer: 59 | Admitting: Advanced Practice Midwife

## 2017-11-05 ENCOUNTER — Ambulatory Visit (INDEPENDENT_AMBULATORY_CARE_PROVIDER_SITE_OTHER): Payer: 59 | Admitting: Advanced Practice Midwife

## 2017-11-05 VITALS — BP 121/73 | HR 95 | Wt 129.0 lb

## 2017-11-05 DIAGNOSIS — Z3483 Encounter for supervision of other normal pregnancy, third trimester: Secondary | ICD-10-CM

## 2017-11-05 DIAGNOSIS — Z3480 Encounter for supervision of other normal pregnancy, unspecified trimester: Secondary | ICD-10-CM

## 2017-11-05 DIAGNOSIS — O4703 False labor before 37 completed weeks of gestation, third trimester: Secondary | ICD-10-CM

## 2017-11-05 DIAGNOSIS — O479 False labor, unspecified: Secondary | ICD-10-CM

## 2017-11-05 NOTE — Progress Notes (Signed)
   PRENATAL VISIT NOTE  Subjective:  Laurie Castro is a 22 y.o. G2P0010 at [redacted]w[redacted]d being seen today for ongoing prenatal care.  She is currently monitored for the following issues for this low-risk pregnancy and has Supervision of other normal pregnancy, antepartum on their problem list.  Patient reports episode of contractions 3-4 in 30 minutes last night that resolved, no contractions today.  Contractions: Irregular. Vag. Bleeding: None.  Movement: Present. Denies leaking of fluid.   The following portions of the patient's history were reviewed and updated as appropriate: allergies, current medications, past family history, past medical history, past social history, past surgical history and problem list. Problem list updated.  Objective:   Vitals:   11/05/17 0933  BP: 121/73  Pulse: 95  Weight: 129 lb (58.5 kg)    Fetal Status: Fetal Heart Rate (bpm): 136 Fundal Height: 35 cm Movement: Present     General:  Alert, oriented and cooperative. Patient is in no acute distress.  Skin: Skin is warm and dry. No rash noted.   Cardiovascular: Normal heart rate noted  Respiratory: Normal respiratory effort, no problems with respiration noted  Abdomen: Soft, gravid, appropriate for gestational age.  Pain/Pressure: Present     Pelvic: Cervical exam performed Dilation: Closed Effacement (%): 0 Station: Ballotable  Extremities: Normal range of motion.  Edema: None  Mental Status: Normal mood and affect. Normal behavior. Normal judgment and thought content.   Assessment and Plan:  Pregnancy: G2P0010 at [redacted]w[redacted]d  1. Supervision of other normal pregnancy, antepartum --Anticipatory guidance about next weeks of pregnancy, next office visits --Pt and s/o have questions about vaccination in pregnancy, specifically the TDAP.  Discussed safety of TDAP in pregnancy.  Discussed recommendation of TDAP in pregnancy to provide passive immunity to fetus and newborn prior to baby's scheduled vaccination.  Discussed Whooping Cough risks to newborn, low risks of vaccination. Pt to decide prior to next visit.  2. Braxton Hicks contractions --Pt desires exam r/t episode of painful cramping last night. --Cervix closed, no evidence of preterm labor --Rest/increase PO fluids/empty bladder/change positions/follow up if cramping greater than 5-6x per hour and or pain is worsening  Preterm labor symptoms and general obstetric precautions including but not limited to vaginal bleeding, contractions, leaking of fluid and fetal movement were reviewed in detail with the patient. Please refer to After Visit Summary for other counseling recommendations.  Return in about 2 weeks (around 11/19/2017).  Future Appointments  Date Time Provider Department Center  11/19/2017 11:15 AM Judeth Horn, NP [facility]  12/03/2017  8:15 AM Judeth Horn, NP [facility]  12/10/2017  8:15 AM Judeth Horn, NP [facility]    Sharen Counter, CNM

## 2017-11-05 NOTE — Patient Instructions (Signed)
Braxton Hicks Contractions °Contractions of the uterus can occur throughout pregnancy, but they are not always a sign that you are in labor. You may have practice contractions called Braxton Hicks contractions. These false labor contractions are sometimes confused with true labor. °What are Braxton Hicks contractions? °Braxton Hicks contractions are tightening movements that occur in the muscles of the uterus before labor. Unlike true labor contractions, these contractions do not result in opening (dilation) and thinning of the cervix. Toward the end of pregnancy (32-34 weeks), Braxton Hicks contractions can happen more often and may become stronger. These contractions are sometimes difficult to tell apart from true labor because they can be very uncomfortable. You should not feel embarrassed if you go to the hospital with false labor. °Sometimes, the only way to tell if you are in true labor is for your health care provider to look for changes in the cervix. The health care provider will do a physical exam and may monitor your contractions. If you are not in true labor, the exam should show that your cervix is not dilating and your water has not broken. °If there are other health problems associated with your pregnancy, it is completely safe for you to be sent home with false labor. You may continue to have Braxton Hicks contractions until you go into true labor. °How to tell the difference between true labor and false labor °True labor °· Contractions last 30-70 seconds. °· Contractions become very regular. °· Discomfort is usually felt in the top of the uterus, and it spreads to the lower abdomen and low back. °· Contractions do not go away with walking. °· Contractions usually become more intense and increase in frequency. °· The cervix dilates and gets thinner. °False labor °· Contractions are usually shorter and not as strong as true labor contractions. °· Contractions are usually irregular. °· Contractions  are often felt in the front of the lower abdomen and in the groin. °· Contractions may go away when you walk around or change positions while lying down. °· Contractions get weaker and are shorter-lasting as time goes on. °· The cervix usually does not dilate or become thin. °Follow these instructions at home: °· Take over-the-counter and prescription medicines only as told by your health care provider. °· Keep up with your usual exercises and follow other instructions from your health care provider. °· Eat and drink lightly if you think you are going into labor. °· If Braxton Hicks contractions are making you uncomfortable: °? Change your position from lying down or resting to walking, or change from walking to resting. °? Sit and rest in a tub of warm water. °? Drink enough fluid to keep your urine pale yellow. Dehydration may cause these contractions. °? Do slow and deep breathing several times an hour. °· Keep all follow-up prenatal visits as told by your health care provider. This is important. °Contact a health care provider if: °· You have a fever. °· You have continuous pain in your abdomen. °Get help right away if: °· Your contractions become stronger, more regular, and closer together. °· You have fluid leaking or gushing from your vagina. °· You pass blood-tinged mucus (bloody show). °· You have bleeding from your vagina. °· You have low back pain that you never had before. °· You feel your baby’s head pushing down and causing pelvic pressure. °· Your baby is not moving inside you as much as it used to. °Summary °· Contractions that occur before labor are called Braxton   Hicks contractions, false labor, or practice contractions. °· Braxton Hicks contractions are usually shorter, weaker, farther apart, and less regular than true labor contractions. True labor contractions usually become progressively stronger and regular and they become more frequent. °· Manage discomfort from Braxton Hicks contractions by  changing position, resting in a warm bath, drinking plenty of water, or practicing deep breathing. °This information is not intended to replace advice given to you by your health care provider. Make sure you discuss any questions you have with your health care provider. °Document Released: 10/17/2016 Document Revised: 10/17/2016 Document Reviewed: 10/17/2016 °Elsevier Interactive Patient Education © 2018 [employer] ° °

## 2017-11-05 NOTE — Progress Notes (Signed)
Pt states was having a lot of contractions last night. Has increased water intake.

## 2017-11-19 ENCOUNTER — Encounter (HOSPITAL_COMMUNITY): Payer: Self-pay

## 2017-11-19 ENCOUNTER — Encounter: Payer: 59 | Admitting: Nurse Practitioner

## 2017-11-19 ENCOUNTER — Ambulatory Visit (INDEPENDENT_AMBULATORY_CARE_PROVIDER_SITE_OTHER): Payer: 59 | Admitting: Student

## 2017-11-19 ENCOUNTER — Other Ambulatory Visit (HOSPITAL_COMMUNITY)
Admission: RE | Admit: 2017-11-19 | Discharge: 2017-11-19 | Disposition: A | Discharge status: Home / Self Care | Payer: 59 | Source: Ambulatory Visit | Attending: Student | Admitting: Student

## 2017-11-19 ENCOUNTER — Inpatient Hospital Stay (HOSPITAL_COMMUNITY)
Admission: AD | Admit: 2017-11-19 | Discharge: 2017-11-19 | Disposition: A | Discharge status: Home / Self Care | Payer: 59 | Source: Ambulatory Visit | Attending: Family Medicine | Admitting: Family Medicine

## 2017-11-19 VITALS — BP 107/70 | HR 75 | Wt 130.0 lb

## 2017-11-19 DIAGNOSIS — B373 Candidiasis of vulva and vagina: Secondary | ICD-10-CM | POA: Insufficient documentation

## 2017-11-19 DIAGNOSIS — O212 Late vomiting of pregnancy: Secondary | ICD-10-CM | POA: Insufficient documentation

## 2017-11-19 DIAGNOSIS — O219 Vomiting of pregnancy, unspecified: Secondary | ICD-10-CM

## 2017-11-19 DIAGNOSIS — O368130 Decreased fetal movements, third trimester, not applicable or unspecified: Secondary | ICD-10-CM | POA: Diagnosis not present

## 2017-11-19 DIAGNOSIS — K219 Gastro-esophageal reflux disease without esophagitis: Secondary | ICD-10-CM | POA: Diagnosis not present

## 2017-11-19 DIAGNOSIS — Z3480 Encounter for supervision of other normal pregnancy, unspecified trimester: Secondary | ICD-10-CM

## 2017-11-19 DIAGNOSIS — Z3A35 35 weeks gestation of pregnancy: Secondary | ICD-10-CM | POA: Diagnosis not present

## 2017-11-19 DIAGNOSIS — Z23 Encounter for immunization: Secondary | ICD-10-CM | POA: Diagnosis not present

## 2017-11-19 DIAGNOSIS — O99613 Diseases of the digestive system complicating pregnancy, third trimester: Secondary | ICD-10-CM | POA: Insufficient documentation

## 2017-11-19 DIAGNOSIS — Z3689 Encounter for other specified antenatal screening: Secondary | ICD-10-CM

## 2017-11-19 LAB — URINALYSIS, ROUTINE W REFLEX MICROSCOPIC
Bilirubin Urine: NEGATIVE
Glucose, UA: NEGATIVE mg/dL
Hgb urine dipstick: NEGATIVE
Ketones, ur: NEGATIVE mg/dL
Leukocytes, UA: NEGATIVE
Nitrite: NEGATIVE
Protein, ur: NEGATIVE mg/dL
Specific Gravity, Urine: 1.009 (ref 1.005–1.030)
pH: 6 (ref 5.0–8.0)

## 2017-11-19 LAB — OB RESULTS CONSOLE GBS: GBS: NEGATIVE

## 2017-11-19 LAB — OB RESULTS CONSOLE GC/CHLAMYDIA: GC PROBE AMP, GENITAL: NEGATIVE

## 2017-11-19 MED ORDER — ONDANSETRON 4 MG PO TBDP
4.0000 mg | ORAL_TABLET | Freq: Once | ORAL | Status: AC
  Administered 2017-11-19: 4 mg via ORAL
  Filled 2017-11-19: qty 1

## 2017-11-19 NOTE — Discharge Instructions (Signed)

## 2017-11-19 NOTE — MAU Note (Signed)
Pt reports decreased fetal movement x2 days. Has felt movement but less than usual. Pt also reports vomiting x2 today, which is unusual for her- states she vomited breakfast and lunch. Pt denies pain at this time, vaginal bleeding or LOF.

## 2017-11-19 NOTE — MAU Provider Note (Signed)
Chief Complaint:  Decreased Fetal Movement and Emesis   First Provider Initiated Contact with Patient 11/19/17 0110      HPI: Laurie Castro is a 22 y.o. G2P0010 at 5335w6dwho presents to maternity admissions reporting decreased fetal movement and nausea. She reports feeling fetal movement but less than usual while she was at work starting around 0500- she reports continued decreased fetal movement until arriving to MAU. She reports nausea that started today, reports vomiting twice this morning and being nauseous the rest of the day. She has not taken any medication for N/V as she does not like taking medication. She denies abdominal pain, LOF, vaginal bleeding, vaginal itching/burning, urinary symptoms, h/a, dizziness, or fever/chills.    Past Medical History: Past Medical History:  Diagnosis Date  . Bacterial vaginosis   . GERD (gastroesophageal reflux disease)   . UTI (urinary tract infection)     Past obstetric history: OB History  Gravida Para Term Preterm AB Living  2       1    SAB TAB Ectopic Multiple Live Births    1          # Outcome Date GA Lbr Len/2nd Weight Sex Delivery Anes PTL Lv  2 Current           1 TAB             Past Surgical History: Past Surgical History:  Procedure Laterality Date  . NO PAST SURGERIES      Family History: Family History  Problem Relation Age of Onset  . Cancer Maternal Grandmother   . Breast cancer Maternal Grandmother     Social History: Social History   Tobacco Use  . Smoking status: Never Smoker  . Smokeless tobacco: Never Used  Substance Use Topics  . Alcohol use: Yes    Alcohol/week: 0.6 oz    Types: 1 Standard drinks or equivalent per week    Comment: Occas. prior to preg.  . Drug use: No    Allergies: No Known Allergies  Meds:  Medications Prior to Admission  Medication Sig Dispense Refill Last Dose  . Prenatal Multivit-Min-Fe-FA (PRENATAL VITAMINS PO) Take by mouth.   11/18/2017 at Unknown time    ROS:   Review of Systems  Respiratory: Negative.   Cardiovascular: Negative.   Gastrointestinal: Positive for nausea and vomiting. Negative for abdominal pain, constipation and diarrhea.  Positive for decreased fetal movement   I have reviewed patient's Past Medical Hx, Surgical Hx, Family Hx, Social Hx, medications and allergies.   Physical Exam   Patient Vitals for the past 24 hrs:  BP Temp Pulse Resp SpO2 Height Weight  11/19/17 0126 112/69 - 93 18 98 % - -  11/19/17 0050 109/72 98 F (36.7 C) 97 16 97 % - -  11/19/17 0041 - - - - - 5\' 1"  (1.549 m) 132 lb (59.9 kg)   Constitutional: Well-developed, well-nourished female in no acute distress.  Cardiovascular: normal rate Respiratory: normal effort GI: Abd soft, non-tender, gravid appropriate for gestational age.  MS: Extremities nontender, no edema, normal ROM Neurologic: Alert and oriented x 4.  PELVIC EXAM: deferred   FHT:  Baseline 130 , moderate variability, accelerations present, no decelerations Contractions: occasional mild contractions- patient does not feel contractions or abdominal cramping    Labs: Results for orders placed or performed during the hospital encounter of 11/19/17 (from the past 24 hour(s))  Urinalysis, Routine w reflex microscopic     Status: None   Collection  Time: 11/19/17 12:38 AM  Result Value Ref Range   Color, Urine YELLOW YELLOW   APPearance CLEAR CLEAR   Specific Gravity, Urine 1.009 1.005 - 1.030   pH 6.0 5.0 - 8.0   Glucose, UA NEGATIVE NEGATIVE mg/dL   Hgb urine dipstick NEGATIVE NEGATIVE   Bilirubin Urine NEGATIVE NEGATIVE   Ketones, ur NEGATIVE NEGATIVE mg/dL   Protein, ur NEGATIVE NEGATIVE mg/dL   Nitrite NEGATIVE NEGATIVE   Leukocytes, UA NEGATIVE NEGATIVE   A/Positive/-- (12/17 1106)  MAU Course/MDM: Orders Placed This Encounter  Procedures  . Urinalysis, Routine w reflex microscopic  . Discharge patient Discharge disposition: 01-Home or Self Care; Discharge patient date:  11/19/2017   UA- negative   Meds ordered this encounter  Medications  . ondansetron (ZOFRAN-ODT) disintegrating tablet 4 mg   NST reviewed- reactive  Treatments in MAU included Zofran 4mg  ODT- pt reports decreased nausea with medication treatment, patient does not want medication prescribed for home use as she "does not like taking medication".   Pt discharge. Pt stable prior to discharge.   Assessment: 1. Decreased fetal movements in third trimester, single or unspecified fetus   2. Decreased fetal movement affecting management of pregnancy in third trimester, single or unspecified fetus   3. Nausea and vomiting during pregnancy   4. NST (non-stress test) reactive     Plan: Discharge home Labor precautions and fetal kick counts Follow up as scheduled for prenatal appointments  Return to MAU as needed   Follow-up Information    [facility].   Specialty:  Obstetrics and Gynecology Why:  Follow up as scheduled for prenatal appointments  Contact information: [address] [place] [address] [phone]          Allergies as of 11/19/2017   No Known Allergies     Medication List    TAKE these medications   PRENATAL VITAMINS PO Take by mouth.       Steward Drone Certified Nurse-Midwife 11/19/2017 1:29 AM

## 2017-11-19 NOTE — Progress Notes (Signed)
   PRENATAL VISIT NOTE  Subjective:  Laurie Castro is a 22 y.o. G2P0010 at 4464w6d being seen today for ongoing prenatal care.  She is currently monitored for the following issues for this low-risk pregnancy and has Supervision of other normal pregnancy, antepartum on their problem list.  Patient reports no bleeding, no contractions, no cramping and no leaking.  Contractions: Irritability. Vag. Bleeding: None.  Movement: Present. Denies leaking of fluid.   Patient seen in MAU overnight for decreased fetal movement. States she feels reassured by reactive NST.  Patient reports she vomited twice yesterday, first after breakfast and again after lunch.  The following portions of the patient's history were reviewed and updated as appropriate: allergies, current medications, past family history, past medical history, past social history, past surgical history and problem list. Problem list updated.  Objective:   Vitals:   11/19/17 1135  BP: 107/70  Pulse: 75  Weight: 130 lb (59 kg)    Fetal Status: Fetal Heart Rate (bpm): 135   Movement: Present     General:  Alert, oriented and cooperative. Patient is in no acute distress.  Skin: Skin is warm and dry. No rash noted.   Cardiovascular: Normal heart rate noted  Respiratory: Normal respiratory effort, no problems with respiration noted  Abdomen: Soft, gravid, appropriate for gestational age.  Pain/Pressure: Absent     Pelvic: Cervical exam deferred        Extremities: Normal range of motion.  Edema: None  Mental Status: Normal mood and affect. Normal behavior. Normal judgment and thought content.   Assessment and Plan:  Pregnancy: G2P0010 at 6164w6d  1. Supervision of other normal pregnancy, antepartum --Feeling tired today after overnight visit to MAU -- Tdap vaccine greater than or equal to 7yo IM -- Culture, beta strep (group b only) --GCC today --Reviewed meal plan for managing nausea/vomiting including but not limited to small  frequent meals, protein at each meal, journaling trigger foods/smells   Preterm labor symptoms and general obstetric precautions including but not limited to vaginal bleeding, contractions, leaking of fluid and fetal movement were reviewed in detail with the patient. Please refer to After Visit Summary for other counseling recommendations.  No follow-ups on file.  Future Appointments  Date Time Provider Department Center  12/03/2017  8:15 AM Jud[facility] HornLawrence, Erin[facility]  12/10/2017  8:15 AM Jud[facility] HornLawrence, Erin[facility]    Roxy CedarSamantha C WavelandWeinhold, [place]  11/19/17 12:05 PM

## 2017-11-20 LAB — CERVICOVAGINAL ANCILLARY ONLY
CHLAMYDIA, DNA PROBE: NEGATIVE
Candida vaginitis: POSITIVE — AB
NEISSERIA GONORRHEA: NEGATIVE
Trichomonas: NEGATIVE

## 2017-11-23 LAB — CULTURE, BETA STREP (GROUP B ONLY): Strep Gp B Culture: NEGATIVE

## 2017-11-27 ENCOUNTER — Other Ambulatory Visit: Payer: Self-pay | Admitting: General Practice

## 2017-11-27 ENCOUNTER — Encounter: Payer: Self-pay | Admitting: Student

## 2017-11-27 DIAGNOSIS — B379 Candidiasis, unspecified: Secondary | ICD-10-CM

## 2017-11-27 MED ORDER — TERCONAZOLE 0.4 % VA CREA: 1.0000 | TOPICAL_CREAM | Freq: Every day | VAGINAL | 0 refills | Status: DC

## 2017-12-03 ENCOUNTER — Ambulatory Visit (INDEPENDENT_AMBULATORY_CARE_PROVIDER_SITE_OTHER): Payer: 59 | Admitting: Nurse Practitioner

## 2017-12-03 VITALS — BP 106/72 | HR 87 | Wt 131.0 lb

## 2017-12-03 DIAGNOSIS — Z3483 Encounter for supervision of other normal pregnancy, third trimester: Secondary | ICD-10-CM

## 2017-12-03 DIAGNOSIS — Z3480 Encounter for supervision of other normal pregnancy, unspecified trimester: Secondary | ICD-10-CM

## 2017-12-03 NOTE — Progress Notes (Signed)
    Subjective:  Laurie Castro is a 22 y.o. G2P0010 at 4711w6d being seen today for ongoing prenatal care.  She is currently monitored for the following issues for this low-risk pregnancy and has Supervision of other normal pregnancy, antepartum on their problem list.  Patient reports no complaints.  Contractions: Irritability. Vag. Bleeding: None.  Movement: Present. Denies leaking of fluid.   The following portions of the patient's history were reviewed and updated as appropriate: allergies, current medications, past family history, past medical history, past social history, past surgical history and problem list. Problem list updated.  Objective:   Vitals:   12/03/17 0824  BP: 106/72  Pulse: 87  Weight: 131 lb (59.4 kg)    Fetal Status: Fetal Heart Rate (bpm): 128 Fundal Height: 38 cm Movement: Present     General:  Alert, oriented and cooperative. Patient is in no acute distress.  Skin: Skin is warm and dry. No rash noted.   Cardiovascular: Normal heart rate noted  Respiratory: Normal respiratory effort, no problems with respiration noted  Abdomen: Soft, gravid, appropriate for gestational age. Pain/Pressure: Present     Pelvic:  Cervical exam deferred        Extremities: Normal range of motion.  Edema: None  Mental Status: Normal mood and affect. Normal behavior. Normal judgment and thought content.   Urinalysis:      Assessment and Plan:  Pregnancy: G2P0010 at 4211w6d  1. Supervision of other normal pregnancy, antepartum [facility], has treated twice for asymptomatic yeast and desires a TOC at next visit to see if yeast has resolved.  Is trying to avoid any problems with thrush with the baby. Advised calling for postpartum appointment if none is scheduled before labor begins. Plans to breastfeed and has attended breastfeeding classes. Has stopped work and is feeling some better. Has decreased appetite so advised smaller more frequent meals and to make  healthy nutritious choices.  Term labor symptoms and general obstetric precautions including but not limited to vaginal bleeding, contractions, leaking of fluid and fetal movement were reviewed in detail with the patient. Please refer to After Visit Summary for other counseling recommendations.  Return in about 1 week (around 12/10/2017).  Nolene BernheimERRI Indiya Izquierdo, RN, MSN, NP-BC Nurse Practitioner, [facility], [facility] 12/03/2017 8:48 AM

## 2017-12-10 ENCOUNTER — Ambulatory Visit (INDEPENDENT_AMBULATORY_CARE_PROVIDER_SITE_OTHER): Payer: 59 | Admitting: Student

## 2017-12-10 ENCOUNTER — Other Ambulatory Visit (HOSPITAL_COMMUNITY)
Admission: RE | Admit: 2017-12-10 | Discharge: 2017-12-10 | Disposition: A | Discharge status: Home / Self Care | Payer: 59 | Source: Ambulatory Visit | Attending: Student | Admitting: Student

## 2017-12-10 VITALS — BP 109/72 | HR 89 | Wt 131.3 lb

## 2017-12-10 DIAGNOSIS — B379 Candidiasis, unspecified: Secondary | ICD-10-CM | POA: Insufficient documentation

## 2017-12-10 DIAGNOSIS — B3731 Acute candidiasis of vulva and vagina: Secondary | ICD-10-CM

## 2017-12-10 DIAGNOSIS — Z3480 Encounter for supervision of other normal pregnancy, unspecified trimester: Secondary | ICD-10-CM

## 2017-12-10 DIAGNOSIS — B373 Candidiasis of vulva and vagina: Secondary | ICD-10-CM

## 2017-12-10 NOTE — Progress Notes (Signed)
   PRENATAL VISIT NOTE  Subjective:  Laurie Castro is a 22 y.o. G2P0010 at 7953w6d being seen today for ongoing prenatal care.  She is currently monitored for the following issues for this low-risk pregnancy and has Supervision of other normal pregnancy, antepartum on their problem list.  Patient reports no complaints. Reports occasional contractions, mainly at night. Contractions: Irregular. Vag. Bleeding: None.  Movement: Present. Denies leaking of fluid.   The following portions of the patient's history were reviewed and updated as appropriate: allergies, current medications, past family history, past medical history, past social history, past surgical history and problem list. Problem list updated.  Objective:   Vitals:   12/10/17 0821  BP: 109/72  Pulse: 89  Weight: 131 lb 4.8 oz (59.6 kg)    Fetal Status: Fetal Heart Rate (bpm): 159 Fundal Height: 38 cm Movement: Present  Presentation: Vertex  General:  Alert, oriented and cooperative. Patient is in no acute distress.  Skin: Skin is warm and dry. No rash noted.   Cardiovascular: Normal heart rate noted  Respiratory: Normal respiratory effort, no problems with respiration noted  Abdomen: Soft, gravid, appropriate for gestational age.  Pain/Pressure: Present     Pelvic: Cervical exam performed Dilation: Closed Effacement (%): 50 Station: -3  Extremities: Normal range of motion.  Edema: None  Mental Status: Normal mood and affect. Normal behavior. Normal judgment and thought content.   Assessment and Plan:  Pregnancy: G2P0010 at 6553w6d  1. Supervision of other normal pregnancy, antepartum -doing well -Unsure about peds. Given list. Encouraged to select pediatrician office prior to delivery.    2. Vaginal yeast infection -no symptoms. Has been treated twice. Wants TOC prior to delivery.  - Cervicovaginal ancillary only  Term labor symptoms and general obstetric precautions including but not limited to vaginal bleeding,  contractions, leaking of fluid and fetal movement were reviewed in detail with the patient. Please refer to After Visit Summary for other counseling recommendations.  Return in about 1 week (around 12/17/2017) for Routine OB.  Future Appointments  Date Time Provider Department Center  12/17/2017  8:15 AM Leftwich-Kirby, Wilmer FloorLisa A, CNM [facility]    Judeth HornErin Aly Seidenberg, NP

## 2017-12-10 NOTE — Patient Instructions (Addendum)
AREA PEDIATRIC/FAMILY PRACTICE PHYSICIANS  [facility] [address] [place], [place]  [address] Phone - [phone]   Fax - [phone]  [facility] [place] [address] [place], [place] [address] Phone - [phone]   Fax - [phone]  JACK AMOS [address] [place], [place]  [address] Phone - [phone]   Fax - [phone]  [facility] [address] [place], [place]  [address] Phone - [phone]   Fax - [phone]  [facility] [address] [place], [place]  [address] Phone - [phone]   Fax - [phone]  [facility] [address] [place], [place]  [address] Phone - [phone]   Fax - [phone]  [facility] [place] [address] [place], [place]  [address] Phone - [phone]   Fax - [phone]  [facility] AT [facility] [address] [place], [address] [place], [place]  [address] Phone - [phone]   Fax - [phone]  [facility] AT [facility] [address] [place], [place]  [address] Phone - [phone]   Fax - [phone] [facility] [place] [address] [place], [place]  [address] Phone - [phone]   Fax - [phone]  [facility] [address] [place], [place]  [address] Phone - [phone]   Fax - [phone]  [facility] [address], Suite [place], [place]  [address] Phone - [phone]   Fax - [phone]  [facility] [place] [address] [place], [place]  [address] Phone - [phone]   Fax - [phone]  [facility] [address], [address] [place], [place]  [address] Phone - [phone]  [facility] [address] [place], [place]  [address] Phone - [phone]   Fax - [phone]  [facility] [address] [place], [place]  [address] Phone - [phone]   Fax - [phone]  [facility] [address] [place], [place]  [address] Phone - [phone]   Fax - [phone]  [facility] [address] [place], [place]  [address] Phone - [phone]   Fax - [phone]   [facility] [address] [place], [place]  [address] Phone - [phone]   Fax - [phone]  [facility] [address] [place], [place]  [address] Phone - [phone]   Fax - [phone]  DAVID RUBIN [address] [place], [place]  [address] Phone - [phone]   Fax - [phone]  [facility] [address] [place], [place]  [address] Phone - [phone]   Fax - [phone]  [facility] Alita Chyle [address] [place], [place]  [address] Phone - [phone]   Fax - [phone] Gerarda Fraction [address] [place], [place]  [address] Phone - [phone]   Fax - [phone]  [facility] [address] [place], [place]  [address] Phone - [phone]   Fax - [phone]  [facility] - [place] [address] [place], [place]  [address] Phone - [phone]   Fax - [phone]  [facility]  Wyvonne Lenz MD [address] [place] [address] Phone [phone]  Fax [phone]     Before [facility] Before your baby arrives it is important to:  Have all of the supplies that you will need to care for your baby.  Know where to go if there is an emergency.  Discuss the baby's arrival with other family members.  What supplies will I need?  It is recommended that you have the following supplies: Large Items  Crib.  Crib mattress.  Rear-facing infant car seat. If possible, have a trained professional check to make sure that it is installed  correctly.  Feeding  6-8 bottles that are 4-5 oz in size.  6-8 nipples.  Bottle brush.  Sterilizer, or a large pan or kettle with a lid.  A way to boil and cool water.  If you will be breastfeeding: ? Breast pump. ? Nipple cream. ? Nursing bra. ? Breast pads. ? Breast shields.  If you will be formula feeding: ? Formula. ? Measuring cups. ? Measuring spoons.  Bathing  Mild baby soap and baby shampoo.  Petroleum jelly.  Soft cloth towel and washcloth.  Hooded towel.  Cotton balls.  Bath basin.  Other Supplies  Rectal thermometer.  Bulb syringe.  Baby wipes or washcloths for diaper changes.  Diaper bag.  Changing pad.  Clothing, including one-piece outfits and pajamas.  Baby nail clippers.  Receiving blankets.  Mattress pad and sheets for the crib.  Night-light for the baby's room.  Baby monitor.  2 or 3 pacifiers.  Either 24-36 cloth diapers and waterproof diaper covers or a box of disposable diapers. You may need to use as many as 10-12 diapers per day.  How do I prepare for an emergency? Prepare for an emergency by:  Knowing how to get to the nearest hospital.  Listing the phone numbers of your baby's health care providers near your home phone and in your cell phone.  How do I prepare my family?  Decide how to handle visitors.  If you have other children: ? Talk with them about the baby coming home. Ask them how they feel about it. ? Read a book together about being a new big brother or sister. ? Find ways to let them help you prepare for the new baby. ? Have someone ready to care for them while you are in the hospital. This information is not intended to replace advice given to you by your health care provider. Make sure you discuss any questions you have with your health care provider. Document Released: 05/16/2008 Document Revised: 11/09/2015 Document Reviewed: 05/11/2014 Elsevier Interactive Patient Education  [employer].

## 2017-12-11 LAB — CERVICOVAGINAL ANCILLARY ONLY: CANDIDA VAGINITIS: NEGATIVE

## 2017-12-17 ENCOUNTER — Ambulatory Visit (INDEPENDENT_AMBULATORY_CARE_PROVIDER_SITE_OTHER): Payer: 59 | Admitting: Advanced Practice Midwife

## 2017-12-17 VITALS — BP 101/69 | HR 95 | Wt 132.5 lb

## 2017-12-17 DIAGNOSIS — Z3480 Encounter for supervision of other normal pregnancy, unspecified trimester: Secondary | ICD-10-CM

## 2017-12-17 NOTE — Progress Notes (Signed)
   PRENATAL VISIT NOTE  Subjective:  [name] is a 22 y.o. G2P0010 at [redacted]w[redacted]d being seen today for ongoing prenatal care.  She is currently monitored for the following issues for this low-risk pregnancy and has Supervision of other normal pregnancy, antepartum on their problem list.  Patient reports no complaints.  Contractions: Irritability. Vag. Bleeding: None.  Movement: Present. Denies leaking of fluid.   The following portions of the patient's history were reviewed and updated as appropriate: allergies, current medications, past family history, past medical history, past social history, past surgical history and problem list. Problem list updated.  Objective:   Vitals:   12/17/17 0820  BP: 101/69  Pulse: 95  Weight: 132 lb 8 oz (60.1 kg)    Fetal Status: Fetal Heart Rate (bpm): 131 Fundal Height: 38 cm Movement: Present  Presentation: Vertex  General:  Alert, oriented and cooperative. Patient is in no acute distress.  Skin: Skin is warm and dry. No rash noted.   Cardiovascular: Normal heart rate noted  Respiratory: Normal respiratory effort, no problems with respiration noted  Abdomen: Soft, gravid, appropriate for gestational age.  Pain/Pressure: Absent     Pelvic: Cervical exam performed Dilation: 1 Effacement (%): 70 Station: -2  Extremities: Normal range of motion.  Edema: Trace  Mental Status: Normal mood and affect. Normal behavior. Normal judgment and thought content.   Assessment and Plan:  Pregnancy: G2P0010 at 4630w6d  1. Supervision of other normal pregnancy, antepartum --Anticipatory guidance about next visits/weeks of pregnancy given. --Membranes swept at pt request today, pt tolerated well. Light spotting on glove after exam. Precautions/reasons to come to MAU reviewed.  Term labor symptoms and general obstetric precautions including but not limited to vaginal bleeding, contractions, leaking of fluid and fetal movement were reviewed in detail with the  patient. Please refer to After Visit Summary for other counseling recommendations.  Return in about 1 week (around 12/24/2017).  Future Appointments  Date Time Provider Department Center  12/23/2017  9:35 AM Judeth HornLawrence, Erin, NP [facility]    Sharen CounterLisa Leftwich-Kirby, CNM

## 2017-12-17 NOTE — Patient Instructions (Signed)

## 2017-12-23 ENCOUNTER — Telehealth (HOSPITAL_COMMUNITY): Payer: Self-pay | Admitting: *Deleted

## 2017-12-23 ENCOUNTER — Ambulatory Visit (INDEPENDENT_AMBULATORY_CARE_PROVIDER_SITE_OTHER): Payer: 59 | Admitting: Student

## 2017-12-23 VITALS — BP 114/76 | HR 100 | Wt 132.1 lb

## 2017-12-23 DIAGNOSIS — O480 Post-term pregnancy: Secondary | ICD-10-CM | POA: Diagnosis not present

## 2017-12-23 DIAGNOSIS — Z3480 Encounter for supervision of other normal pregnancy, unspecified trimester: Secondary | ICD-10-CM

## 2017-12-23 DIAGNOSIS — Z3483 Encounter for supervision of other normal pregnancy, third trimester: Secondary | ICD-10-CM

## 2017-12-23 NOTE — Patient Instructions (Signed)

## 2017-12-23 NOTE — Progress Notes (Signed)
Pt states contractions last night between 8 mins apart, lost fluids

## 2017-12-23 NOTE — Telephone Encounter (Signed)
Preadmission screen  

## 2017-12-23 NOTE — Progress Notes (Signed)
   PRENATAL VISIT NOTE  Subjective:  Laurie Castro is a 22 y.o. G2P0010 at 4845w5d being seen today for ongoing prenatal care.  She is currently monitored for the following issues for this low-risk pregnancy and has Supervision of other normal pregnancy, antepartum on their problem list.  Patient reports occasional contractions.  Contractions: Irregular. Vag. Bleeding: None.  Movement: Present. Denies leaking of fluid.   The following portions of the patient's history were reviewed and updated as appropriate: allergies, current medications, past family history, past medical history, past social history, past surgical history and problem list. Problem list updated.  Objective:   Vitals:   12/23/17 1020  BP: 114/76  Pulse: 100  Weight: 132 lb 1.6 oz (59.9 kg)    Fetal Status: Fetal Heart Rate (bpm): 131 Fundal Height: 39 cm Movement: Present  Presentation: Vertex  General:  Alert, oriented and cooperative. Patient is in no acute distress.  Skin: Skin is warm and dry. No rash noted.   Cardiovascular: Normal heart rate noted  Respiratory: Normal respiratory effort, no problems with respiration noted  Abdomen: Soft, gravid, appropriate for gestational age.  Pain/Pressure: Present     Pelvic: Cervical exam performed Dilation: 1 Effacement (%): 60 Station: -3  Extremities: Normal range of motion.  Edema: None  Mental Status: Normal mood and affect. Normal behavior. Normal judgment and thought content.   Assessment and Plan:  Pregnancy: G2P0010 at 5845w5d  1. Supervision of other normal pregnancy, antepartum -reactive NST today -Discussed plans for IOL at 41 wks. Good candidate for foley bulb induction & pt is interested in this. Scheduled for IOL on 7/11; pt to return to office tomorrow afternoon for foley balloon placement.   2. Post term pregnancy, antepartum condition or complication  - Fetal nonstress test  Term labor symptoms and general obstetric precautions including but not  limited to vaginal bleeding, contractions, leaking of fluid and fetal movement were reviewed in detail with the patient. Please refer to After Visit Summary for other counseling recommendations.  Return in about 1 day (around 12/24/2017) for return tomorrow at 230 pm for foley bulb placement.  Future Appointments  Date Time Provider Department Center  12/24/2017  2:15 PM [facility] NST [facility]  12/24/2017  3:15 PM Anyanwu, Jethro BastosUgonna A, MD [facility]  12/25/2017  7:00 AM [facility] ROOM [facility] None    Judeth HornErin Anika Shore, NP

## 2017-12-24 ENCOUNTER — Ambulatory Visit (INDEPENDENT_AMBULATORY_CARE_PROVIDER_SITE_OTHER): Payer: 59 | Admitting: *Deleted

## 2017-12-24 ENCOUNTER — Ambulatory Visit (INDEPENDENT_AMBULATORY_CARE_PROVIDER_SITE_OTHER): Payer: 59 | Admitting: Family Medicine

## 2017-12-24 VITALS — BP 105/66 | HR 94 | Wt 132.0 lb

## 2017-12-24 DIAGNOSIS — Z3480 Encounter for supervision of other normal pregnancy, unspecified trimester: Secondary | ICD-10-CM

## 2017-12-24 DIAGNOSIS — O480 Post-term pregnancy: Secondary | ICD-10-CM

## 2017-12-24 DIAGNOSIS — Z3483 Encounter for supervision of other normal pregnancy, third trimester: Secondary | ICD-10-CM

## 2017-12-24 NOTE — Patient Instructions (Signed)

## 2017-12-24 NOTE — Progress Notes (Signed)
   PRENATAL VISIT NOTE  Subjective:  Laurie Castro is a 22 y.o. G2P0010 at 6645w6d being seen today for ongoing prenatal care.  She is currently monitored for the following issues for this low-risk pregnancy and has Supervision of other normal pregnancy, antepartum on their problem list.  Patient reports no complaints.   .  .   . Denies leaking of fluid.   The following portions of the patient's history were reviewed and updated as appropriate: allergies, current medications, past family history, past medical history, past social history, past surgical history and problem list. Problem list updated.  Objective:  There were no vitals filed for this visit.  Fetal Status:        Presentation: Vertex  General:  Alert, oriented and cooperative. Patient is in no acute distress.  Skin: Skin is warm and dry. No rash noted.   Cardiovascular: Normal heart rate noted  Respiratory: Normal respiratory effort, no problems with respiration noted  Abdomen: Soft, gravid, appropriate for gestational age.        Pelvic: Cervical exam deferred        Extremities: Normal range of motion.     Mental Status:  Normal mood and affect. Normal behavior. Normal judgment and thought content.  Procedure: Patient informed of R/B/A of procedure. NST was performed and was reactive prior to procedure. NST:  EFM: Baseline: 120 bpm, Variability: Good {> 6 bpm), Accelerations: Reactive and Decelerations: Absent Toco: none Procedure done to begin ripening of the cervix prior to admission for induction of labor. Appropriate time out taken. The patient was placed in the lithotomy position and the cervix brought into view with sterile speculum. A ring forcep was used to guide the 68F foley balloon through the internal os of the cervix. Foley Balloon filled with 60cc of sterile water. Plug inserted into end of the foley. Foley placed on tension and taped to medical thigh.  NST:  EFM Baseline: 120 bpm, Variability: Good {> 6  bpm), Accelerations: Reactive and Decelerations: Absent  Toco: none There were no signs of tachysystole or hypertonus. All equipment was removed and accounted for. The patient tolerated the procedure well.  Assessment and Plan:  Pregnancy: G2P0010 at 2845w6d 1. Supervision of other normal pregnancy, antepartum For IOL tomorrow S/p Outpatient placement of foley balloon catheter for cervical ripening. Induction of labor scheduled for tomorrow at 0700 am. Reassuring FHR tracing with no concerns at present. Warning signs given to patient to include return to MAU for heavy vaginal bleeding, Rupture of membranes, painful uterine contractions q 5 mins or less, severe abdominal discomfort, decreased fetal movement.  No follow-ups on file.   Reva Boresanya S Craven Crean, MD 12/24/2017 4:08 PM

## 2017-12-24 NOTE — Progress Notes (Signed)
NST completed before and after Foley bulb placement

## 2017-12-25 ENCOUNTER — Inpatient Hospital Stay (HOSPITAL_COMMUNITY)
Admission: RE | Admit: 2017-12-25 | Discharge: 2017-12-28 | DRG: 807 | Disposition: A | Discharge status: Home / Self Care | Payer: 59 | Source: Ambulatory Visit | Attending: Obstetrics & Gynecology | Admitting: Obstetrics & Gynecology

## 2017-12-25 ENCOUNTER — Inpatient Hospital Stay (HOSPITAL_COMMUNITY): Payer: 59 | Admitting: Anesthesiology

## 2017-12-25 ENCOUNTER — Encounter (HOSPITAL_COMMUNITY): Payer: Self-pay

## 2017-12-25 DIAGNOSIS — Z3A41 41 weeks gestation of pregnancy: Secondary | ICD-10-CM

## 2017-12-25 DIAGNOSIS — O480 Post-term pregnancy: Secondary | ICD-10-CM | POA: Diagnosis present

## 2017-12-25 DIAGNOSIS — Z3480 Encounter for supervision of other normal pregnancy, unspecified trimester: Secondary | ICD-10-CM

## 2017-12-25 DIAGNOSIS — Z3490 Encounter for supervision of normal pregnancy, unspecified, unspecified trimester: Secondary | ICD-10-CM

## 2017-12-25 LAB — TYPE AND SCREEN
ABO/RH(D): A POS
Antibody Screen: NEGATIVE

## 2017-12-25 LAB — CBC
HCT: 37.7 % (ref 36.0–46.0)
HEMOGLOBIN: 13.5 g/dL (ref 12.0–15.0)
MCH: 35.3 pg — ABNORMAL HIGH (ref 26.0–34.0)
MCHC: 35.8 g/dL (ref 30.0–36.0)
MCV: 98.7 fL (ref 78.0–100.0)
PLATELETS: 172 10*3/uL (ref 150–400)
RBC: 3.82 MIL/uL — AB (ref 3.87–5.11)
RDW: 13.6 % (ref 11.5–15.5)
WBC: 12.7 10*3/uL — AB (ref 4.0–10.5)

## 2017-12-25 LAB — ABO/RH: ABO/RH(D): A POS

## 2017-12-25 LAB — RPR: RPR: NONREACTIVE

## 2017-12-25 MED ORDER — FLEET ENEMA 7-19 GM/118ML RE ENEM: 1.0000 | ENEMA | RECTAL | Status: DC | PRN

## 2017-12-25 MED ORDER — PHENYLEPHRINE 40 MCG/ML (10ML) SYRINGE FOR IV PUSH (FOR BLOOD PRESSURE SUPPORT)
80.0000 ug | PREFILLED_SYRINGE | INTRAVENOUS | Status: DC | PRN
Start: 2017-12-25 — End: 2017-12-26
  Filled 2017-12-25: qty 5

## 2017-12-25 MED ORDER — SOD CITRATE-CITRIC ACID 500-334 MG/5ML PO SOLN: 30.0000 mL | ORAL | Status: DC | PRN

## 2017-12-25 MED ORDER — OXYCODONE-ACETAMINOPHEN 5-325 MG PO TABS: 1.0000 | ORAL_TABLET | ORAL | Status: DC | PRN

## 2017-12-25 MED ORDER — ACETAMINOPHEN 325 MG PO TABS: 650.0000 mg | ORAL_TABLET | ORAL | Status: DC | PRN

## 2017-12-25 MED ORDER — OXYTOCIN 40 UNITS IN LACTATED RINGERS INFUSION - SIMPLE MED
2.5000 [IU]/h | INTRAVENOUS | Status: DC
  Administered 2017-12-26: 2.5 [IU]/h via INTRAVENOUS
  Filled 2017-12-25: qty 1000

## 2017-12-25 MED ORDER — OXYTOCIN 40 UNITS IN LACTATED RINGERS INFUSION - SIMPLE MED
1.0000 m[IU]/min | INTRAVENOUS | Status: DC
  Administered 2017-12-25: 2 m[IU]/min via INTRAVENOUS
  Administered 2017-12-26: 32 m[IU]/min via INTRAVENOUS
  Filled 2017-12-25 (×2): qty 1000

## 2017-12-25 MED ORDER — FENTANYL 2.5 MCG/ML BUPIVACAINE 1/10 % EPIDURAL INFUSION (WH - ANES)
14.0000 mL/h | INTRAMUSCULAR | Status: DC | PRN
  Administered 2017-12-26 (×2): 14 mL/h via EPIDURAL
  Administered 2017-12-26: 12 mL/h via EPIDURAL
  Filled 2017-12-25 (×2): qty 100

## 2017-12-25 MED ORDER — PHENYLEPHRINE 40 MCG/ML (10ML) SYRINGE FOR IV PUSH (FOR BLOOD PRESSURE SUPPORT)
PREFILLED_SYRINGE | INTRAVENOUS | Status: AC
  Filled 2017-12-25: qty 10

## 2017-12-25 MED ORDER — LACTATED RINGERS IV SOLN: 500.0000 mL | Freq: Once | INTRAVENOUS | Status: DC

## 2017-12-25 MED ORDER — LACTATED RINGERS IV SOLN
INTRAVENOUS | Status: DC
  Administered 2017-12-25 – 2017-12-26 (×4): via INTRAVENOUS

## 2017-12-25 MED ORDER — PHENYLEPHRINE 40 MCG/ML (10ML) SYRINGE FOR IV PUSH (FOR BLOOD PRESSURE SUPPORT)
80.0000 ug | PREFILLED_SYRINGE | INTRAVENOUS | Status: DC | PRN
  Filled 2017-12-25: qty 5

## 2017-12-25 MED ORDER — LIDOCAINE HCL (PF) 1 % IJ SOLN
30.0000 mL | INTRAMUSCULAR | Status: DC | PRN
  Administered 2017-12-26: 30 mL via SUBCUTANEOUS
  Filled 2017-12-25: qty 30

## 2017-12-25 MED ORDER — DIPHENHYDRAMINE HCL 50 MG/ML IJ SOLN: 12.5000 mg | INTRAMUSCULAR | Status: DC | PRN

## 2017-12-25 MED ORDER — OXYTOCIN BOLUS FROM INFUSION
500.0000 mL | Freq: Once | INTRAVENOUS | Status: AC
  Administered 2017-12-26: 500 mL via INTRAVENOUS

## 2017-12-25 MED ORDER — OXYCODONE-ACETAMINOPHEN 5-325 MG PO TABS: 2.0000 | ORAL_TABLET | ORAL | Status: DC | PRN

## 2017-12-25 MED ORDER — ONDANSETRON HCL 4 MG/2ML IJ SOLN
4.0000 mg | Freq: Four times a day (QID) | INTRAMUSCULAR | Status: DC | PRN
  Administered 2017-12-26: 4 mg via INTRAVENOUS
  Filled 2017-12-25: qty 2

## 2017-12-25 MED ORDER — LACTATED RINGERS IV SOLN: 500.0000 mL | INTRAVENOUS | Status: DC | PRN

## 2017-12-25 MED ORDER — EPHEDRINE 5 MG/ML INJ
10.0000 mg | INTRAVENOUS | Status: DC | PRN
  Filled 2017-12-25: qty 2

## 2017-12-25 MED ORDER — FENTANYL 2.5 MCG/ML BUPIVACAINE 1/10 % EPIDURAL INFUSION (WH - ANES)
INTRAMUSCULAR | Status: AC
  Filled 2017-12-25: qty 100

## 2017-12-25 MED ORDER — TERBUTALINE SULFATE 1 MG/ML IJ SOLN
0.2500 mg | Freq: Once | INTRAMUSCULAR | Status: DC | PRN
  Filled 2017-12-25: qty 1

## 2017-12-25 NOTE — Anesthesia Pain Management Evaluation Note (Signed)
  CRNA Pain Management Visit Note  Patient: Laurie Castro, 22 y.o., female  "Hello I am a member of the anesthesia team at [facility]. We have an anesthesia team available at all times to provide care throughout the hospital, including epidural management and anesthesia for C-section. I don't know your plan for the delivery whether it a natural birth, water birth, IV sedation, nitrous supplementation, doula or epidural, but we want to meet your pain goals."   1.Was your pain managed to your expectations on prior hospitalizations?   No prior hospitalizations  2.What is your expectation for pain management during this hospitalization?     Labor support without medications, Epidural, IV pain meds and Nitrous Oxide  3.How can we help you reach that goal? Pt open to discussion about all methods of pain control. Questions answered.  Record the patient's initial score and the patient's pain goal.   Pain: 5  Pain Goal: 8 [facility] wants you to be able to say your pain was always managed very well.  Kaleb Sek 12/25/2017

## 2017-12-25 NOTE — H&P (Addendum)
OBSTETRIC ADMISSION HISTORY AND PHYSICAL  Laurie Castro is a 22 y.o. female G2P0010 with IUP at [redacted]w[redacted]d by u/s at 52.6 w presenting for IOL for post dates. She reports contractions since foley bulb was placed in clinic yesterday which she decribes as irregular, every 8-10 mintes, and lasting one minute,  +FMs, mild leakage of fluid ~ 2x day since loss of mucous plug, no VB, no blurry vision, headaches or peripheral edema, and RUQ pain.  She plans on breast feeding. She request oral contraceptives for birth control. She received her prenatal care at [facility]  Prenatal History/Complications: patient is post term at 41 weeks.  No other compilcations during pregnancy.   Past Medical History: Past Medical History:  Diagnosis Date  . Bacterial vaginosis   . GERD (gastroesophageal reflux disease)   . UTI (urinary tract infection)     Past Surgical History: Past Surgical History:  Procedure Laterality Date  . NO PAST SURGERIES      Obstetrical History: OB History    Gravida  2   Para      Term      Preterm      AB  1   Living        SAB      TAB  1   Ectopic      Multiple      Live Births              Social History: Social History   Socioeconomic History  . Marital status: Single    Spouse name: Not on file  . Number of children: Not on file  . Years of education: Not on file  . Highest education level: Not on file  Occupational History  . Not on file  Social Needs  . Financial resource strain: Not on file  . Food insecurity:    Worry: Not on file    Inability: Not on file  . Transportation needs:    Medical: Not on file    Non-medical: Not on file  Tobacco Use  . Smoking status: Never Smoker  . Smokeless tobacco: Never Used  Substance and Sexual Activity  . Alcohol use: Yes    Alcohol/week: 0.6 oz    Types: 1 Standard drinks or equivalent per week    Comment: Occas. prior to preg.  . Drug use: No  . Sexual activity: Yes    Partners: Male   Birth control/protection: None  Lifestyle  . Physical activity:    Days per week: Not on file    Minutes per session: Not on file  . Stress: Not on file  Relationships  . Social connections:    Talks on phone: Not on file    Gets together: Not on file    Attends religious service: Not on file    Active member of club or organization: Not on file    Attends meetings of clubs or organizations: Not on file    Relationship status: Not on file  Other Topics Concern  . Not on file  Social History Narrative  . Not on file    Family History: Family History  Problem Relation Age of Onset  . Cancer Maternal Grandmother   . Breast cancer Maternal Grandmother     Allergies: No Known Allergies  Medications Prior to Admission  Medication Sig Dispense Refill Last Dose  . acetaminophen (TYLENOL) 500 MG tablet Take 500 mg by mouth every 6 (six) hours as needed for mild pain.   12/24/2017  at Unknown time  . Prenatal Multivit-Min-Fe-FA (PRENATAL VITAMINS PO) Take 1 tablet by mouth daily.    12/24/2017 at Unknown time     Review of Systems  All systems reviewed and negative except as stated in HPI  Blood pressure 107/67, pulse 94, temperature 97.8 F (36.6 C), temperature source Oral, resp. rate 18, height 5\' 1"  (1.549 m), weight 60.1 kg (132 lb 9 oz), last menstrual period 03/22/2017, unknown if currently breastfeeding. General appearance: alert, cooperative and no distress Lungs: clear to auscultation bilaterally Heart: regular rate and rhythm Abdomen: soft, non-tender; bowel sounds normal Extremities: Homans sign is negative, no sign of DVT Presentation: cephalic Fetal monitoringBaseline: 120 bpm, Variability: Good {> 6 bpm) and Accelerations: Reactive Uterine activity, Frequency: Every 4-5 minutes, Duration: 60 seconds and Intensity: mild Dilation: 4 Effacement (%): 60 Station: -3 Exam by:: Santina Evansatherine stout RN   Prenatal labs: ABO, Rh: --/--/A POS (07/11 0746) Antibody: NEG  (07/11 0746) Rubella: 4.53 (12/17 1106) RPR: Non Reactive (04/10 0843)  HBsAg: Negative (12/17 1106)  HIV: Non Reactive (04/10 0843)  GBS: Negative (06/05 0000)  Genetic screening  Quad negative Anatomy US normal  Prenatal Transfer Tool  Maternal Diabetes: No Genetic Screening: Normal Maternal Ultrasounds/Referrals: Normal Fetal Ultrasounds or other Referrals:  None Maternal Substance Abuse:  No Significant Maternal Medications:  None Significant Maternal Lab Results: None  Results for orders placed or performed during the hospital encounter of 12/25/17 (from the past 24 hour(s))  CBC   Collection Time: 12/25/17  7:46 AM  Result Value Ref Range   WBC 12.7 (H) 4.0 - 10.5 K/uL   RBC 3.82 (L) 3.87 - 5.11 MIL/uL   Hemoglobin 13.5 12.0 - 15.0 g/dL   HCT 08.637.7 57.836.0 - 46.946.0 %   MCV 98.7 78.0 - 100.0 fL   MCH 35.3 (H) 26.0 - 34.0 pg   MCHC 35.8 30.0 - 36.0 g/dL   RDW 62.913.6 52.811.5 - 41.315.5 %   Platelets 172 150 - 400 K/uL  Type and screen [facility]   Collection Time: 12/25/17  7:46 AM  Result Value Ref Range   ABO/RH(D) A POS    Antibody Screen NEG    Sample Expiration      12/28/2017 Performed at [facility], [address] [place], [place] [id number]     Patient Active Problem List   Diagnosis Date Noted  . Pregnancy 12/25/2017  . Supervision of other normal pregnancy, antepartum 06/02/2017    Assessment/Plan:  Laurie L Snader is a 22 y.o. G2P0010 at 2557w0d here for IOL for post dates.  She has an otherwise uncomplicated pregnancy.    #Labor: IOL.  s/p foley bulb placed outpatient.  Start pitocin.  #Pain: Planning on epidural #FWB: Cat I #ID:  GBS neg #MOF: breast #MOC:POP  Sandre Kittyaniel K Olson, MD  12/25/2017, 11:39 AM  OB FELLOW HISTORY AND PHYSICAL ATTESTATION  I confirm that I have verified the information documented in the resident's note and that I have also personally reperformed the physical exam and all medical decision making activities.  I agree with above documentation and have made edits as needed.   Caryl AdaJazma Akera Snowberger, DO OB Fellow 12/25/2017, 1:23 PM

## 2017-12-25 NOTE — Progress Notes (Signed)
Labor Progress Note  [name] is a 22 y.o. G2P0010 at 386w0d  admitted for induction of labor due to Post dates.  S: Doing well. Comfortable. Planning on natural delivery.  O:  BP 117/71   Pulse 87   Temp 98.4 F (36.9 C) (Oral)   Resp 18   Ht 5\' 1"  (1.549 m)   Wt 60.1 kg (132 lb 9 oz)   LMP 03/22/2017 (Exact Date)   BMI 25.05 kg/m   Total I/O In: 836.5 [I.V.:836.5] Out: -   FHT:  FHR: 125 bpm, variability: moderate,  accelerations:  Present,  decelerations:  Absent UC:   irregular, every 2-6 minutes SVE:   Dilation: 4 Effacement (%): 60 Station: -3 Exam by:: Dr. Doroteo GlassmanPhelps  Pitocin @ 12 mu/min  Labs: Lab Results  Component Value Date   WBC 12.7 (H) 12/25/2017   HGB 13.5 12/25/2017   HCT 37.7 12/25/2017   MCV 98.7 12/25/2017   PLT 172 12/25/2017    Assessment / Plan: 22 y.o. G2P0010 656w0d. Early labor Induction of labor due to postterm,  progressing well on pitocin  Labor: S/p FB. Progressing on Pitocin, will continue to increase then AROM Fetal Wellbeing:  Category I Pain Control:  Labor support without medications Anticipated MOD:  NSVD  Expectant management   Caryl AdaJazma Averyana Pillars, DO OB Fellow Center for [facility], [facility]

## 2017-12-25 NOTE — Progress Notes (Addendum)
Labor Progress Note  Laurie Castro is a 22 y.o. G2P0010 at 316w0d  admitted for induction of labor due to Post dates.  S: Pt tolerating pain well however, pain is getting stronger. No complaints otherwise.   O:  BP 120/74   Pulse 88   Temp 98 F (36.7 C) (Oral)   Resp 18   Ht 5\' 1"  (1.549 m)   Wt 60.1 kg (132 lb 9 oz)   LMP 03/22/2017 (Exact Date)   BMI 25.05 kg/m   Total I/O In: 145.8 [I.V.:145.8] Out: -   FHT:  FHR: 135 bpm, variability: moderate,  accelerations:  Present,  decelerations:  Absent UC:   regular, every 2-3 minutes SVE:   Dilation: 4 Effacement (%): 60 Station: -3 Exam by:: Dr. Doroteo GlassmanPhelps Membranes still intact  Pitocin @ 2.5 mu/min  Labs: Lab Results  Component Value Date   WBC 12.7 (H) 12/25/2017   HGB 13.5 12/25/2017   HCT 37.7 12/25/2017   MCV 98.7 12/25/2017   PLT 172 12/25/2017    Assessment / Plan: 22 y.o. G2P0010 1616w0d. In early labor Induction of labor due to postterm,  progressing well on pitocin  Labor: Progressing well on Pitocin, will AROM once in active labor Fetal Wellbeing:  Category I Pain Control:  Labor support without medications Anticipated MOD:  NSVD  Expectant management   Laurie PinkLexi Castro, MS3 12/25/17 9:02 PM  [Late entry]  I personally saw and evaluated the patient with the student and agree with above documentation.  Ellwood DenseAlison Rumball, DO PGY-2, [facility] 12/26/2017 12:19 AM

## 2017-12-25 NOTE — Anesthesia Preprocedure Evaluation (Addendum)
Anesthesia Evaluation  Patient identified by MRN, date of birth, ID band Patient awake    Reviewed: Allergy & Precautions, NPO status , Patient's Chart, lab work & pertinent test results  History of Anesthesia Complications Negative for: history of anesthetic complications  Airway Mallampati: II  TM Distance: >3 FB Neck ROM: Full    Dental  (+) Dental Advisory Given   Pulmonary neg pulmonary ROS,    breath sounds clear to auscultation       Cardiovascular negative cardio ROS   Rhythm:Regular Rate:Normal     Neuro/Psych negative neurological ROS  negative psych ROS   GI/Hepatic Neg liver ROS, GERD  Controlled,  Endo/Other  negative endocrine ROS  Renal/GU negative Renal ROS  negative genitourinary   Musculoskeletal negative musculoskeletal ROS (+)   Abdominal   Peds  Hematology negative hematology ROS (+)   Anesthesia Other Findings   Reproductive/Obstetrics (+) Pregnancy                            Anesthesia Physical Anesthesia Plan  ASA: II  Anesthesia Plan: Epidural   Post-op Pain Management:    Induction:   PONV Risk Score and Plan:   Airway Management Planned: Natural Airway  Additional Equipment: None  Intra-op Plan:   Post-operative Plan:   Informed Consent: I have reviewed the patients History and Physical, chart, labs and discussed the procedure including the risks, benefits and alternatives for the proposed anesthesia with the patient or authorized representative who has indicated his/her understanding and acceptance.     Plan Discussed with: Anesthesiologist  Anesthesia Plan Comments: (Labs reviewed. Platelets acceptable, patient not taking any blood thinning medications. Risks and benefits discussed with patient, patient expressed understanding and wished to proceed.)        Anesthesia Quick Evaluation

## 2017-12-26 ENCOUNTER — Encounter (HOSPITAL_COMMUNITY): Payer: Self-pay

## 2017-12-26 DIAGNOSIS — O480 Post-term pregnancy: Secondary | ICD-10-CM

## 2017-12-26 DIAGNOSIS — Z3A41 41 weeks gestation of pregnancy: Secondary | ICD-10-CM

## 2017-12-26 MED ORDER — PRENATAL MULTIVITAMIN CH
1.0000 | ORAL_TABLET | Freq: Every day | ORAL | Status: DC
  Administered 2017-12-27: 1 via ORAL
  Filled 2017-12-26: qty 1

## 2017-12-26 MED ORDER — LIDOCAINE HCL (PF) 1 % IJ SOLN
INTRAMUSCULAR | Status: DC | PRN
  Administered 2017-12-26: 6 mL via EPIDURAL
  Administered 2017-12-26: 4 mL via EPIDURAL

## 2017-12-26 MED ORDER — ACETAMINOPHEN 325 MG PO TABS
650.0000 mg | ORAL_TABLET | ORAL | Status: DC | PRN
  Administered 2017-12-27: 650 mg via ORAL
  Filled 2017-12-26: qty 2

## 2017-12-26 MED ORDER — BENZOCAINE-MENTHOL 20-0.5 % EX AERO
1.0000 "application " | INHALATION_SPRAY | CUTANEOUS | Status: DC | PRN
  Administered 2017-12-26: 1 via TOPICAL
  Filled 2017-12-26: qty 56

## 2017-12-26 MED ORDER — DIBUCAINE 1 % RE OINT: 1.0000 "application " | TOPICAL_OINTMENT | RECTAL | Status: DC | PRN

## 2017-12-26 MED ORDER — COCONUT OIL OIL: 1.0000 "application " | TOPICAL_OIL | Status: DC | PRN

## 2017-12-26 MED ORDER — WITCH HAZEL-GLYCERIN EX PADS: 1.0000 "application " | MEDICATED_PAD | CUTANEOUS | Status: DC | PRN

## 2017-12-26 MED ORDER — ZOLPIDEM TARTRATE 5 MG PO TABS: 5.0000 mg | ORAL_TABLET | Freq: Every evening | ORAL | Status: DC | PRN

## 2017-12-26 MED ORDER — TETANUS-DIPHTH-ACELL PERTUSSIS 5-2.5-18.5 LF-MCG/0.5 IM SUSP: 0.5000 mL | Freq: Once | INTRAMUSCULAR | Status: DC

## 2017-12-26 MED ORDER — DIPHENHYDRAMINE HCL 25 MG PO CAPS: 25.0000 mg | ORAL_CAPSULE | Freq: Four times a day (QID) | ORAL | Status: DC | PRN

## 2017-12-26 MED ORDER — IBUPROFEN 600 MG PO TABS
600.0000 mg | ORAL_TABLET | Freq: Four times a day (QID) | ORAL | Status: DC
Start: 2017-12-26 — End: 2017-12-28
  Administered 2017-12-26 – 2017-12-28 (×5): 600 mg via ORAL
  Filled 2017-12-26 (×6): qty 1

## 2017-12-26 MED ORDER — SIMETHICONE 80 MG PO CHEW: 80.0000 mg | CHEWABLE_TABLET | ORAL | Status: DC | PRN

## 2017-12-26 MED ORDER — ONDANSETRON HCL 4 MG PO TABS: 4.0000 mg | ORAL_TABLET | ORAL | Status: DC | PRN

## 2017-12-26 MED ORDER — ONDANSETRON HCL 4 MG/2ML IJ SOLN: 4.0000 mg | INTRAMUSCULAR | Status: DC | PRN

## 2017-12-26 MED ORDER — SENNOSIDES-DOCUSATE SODIUM 8.6-50 MG PO TABS
2.0000 | ORAL_TABLET | ORAL | Status: DC
  Administered 2017-12-27 – 2017-12-28 (×2): 2 via ORAL
  Filled 2017-12-26 (×2): qty 2

## 2017-12-26 NOTE — Progress Notes (Signed)
Comfortable w/epidural.  No c/o pressure.  AROM w/clear fluid and IUPC placed. Cx 6-80/0, -1.  MVU's ~ 220, ctx pattern somewhat irregular, q 1-3 minutes.  Pitocin at 32 mu;min.  FHR Cat 1.  Continue present mgt, making cervical/station change

## 2017-12-26 NOTE — Progress Notes (Signed)
Labor Progress Note Laurie Castro is a 22 y.o. G2P0010 at 1781w1d presented for IOL for postdates.  S: Resting comfortably, sleeping.  O:  BP (!) 94/59   Pulse 79   Temp 98 F (36.7 C) (Oral)   Resp 16   Ht 5\' 1"  (1.549 m)   Wt 132 lb 9 oz (60.1 kg)   LMP 03/22/2017 (Exact Date)   SpO2 97%   BMI 25.05 kg/m   ZOX:WRUEAVWUFHT:baseline rate 120, moderate variability, + acels, no decels Toco: ctx every 2-6 min  CVE: Dilation: 6 Effacement (%): 70, 80 Station: -2 Presentation: Vertex Exam by:: Hermenia BersMichelle Kelly, RN  A&P: 22 y.o. G2P0010 3381w1d here for IOL for PD Labor: Not much change since last check, continue to titrate Pitocin up to 40. If still stalling, can consider washout. Pain: Epidural FWB: Cat I  Laurie Shaheen, DO 5:00 AM

## 2017-12-26 NOTE — Progress Notes (Addendum)
Labor Progress Note Laurie Castro is a 22 y.o. G2P0010 at 7837w1d presented for IOL for postdates.  S: Feeling increased pelvic pressure.  O:  BP 102/63   Pulse 85   Temp 99.1 F (37.3 C) (Oral)   Resp 20   Ht 5\' 1"  (1.549 m)   Wt 132 lb 9 oz (60.1 kg)   LMP 03/22/2017 (Exact Date)   SpO2 97%   BMI 25.05 kg/m   ZOX:WRUEAVWUFHT:Baseline rate 130, moderate variability, + accels, some variable decels Toco: ctx every 2-3 min  CVE: Dilation: 10 Dilation Complete Date: 12/26/17 Dilation Complete Time: 1649 Effacement (%): 100 Cervical Position: Middle Station: Plus 1 Presentation: Vertex Exam by:: Dr. Macon LargeAnyanwu   A&P: 22 y.o. G2P0010 7137w1d here for IOL for PD, now in active phase!!! Will start pushing soon Reassuring FHT tracing Hopeful for vaginal delivery  Laurie CollinsUgonna Alisyn Lequire, MD 4:49 PM

## 2017-12-26 NOTE — Progress Notes (Addendum)
Labor Progress Note [name] is a 22 y.o. G2P0010 at 3060w1d presented for IOL for postdates.  S: Resting comfortably with epidural  O:  BP 112/72   Pulse 93   Temp 98.1 F (36.7 C) (Oral)   Resp 18   Ht 5\' 1"  (1.549 m)   Wt 132 lb 9 oz (60.1 kg)   LMP 03/22/2017 (Exact Date)   SpO2 100%   BMI 25.05 kg/m   GMW:NUUVOZDGFHT:Baseline rate 130, moderate variability, + acels, no decels Toco: ctx every 2-3 min  CVE: Dilation: 6 Effacement (%): (Edema) Station: 0 Presentation: Vertex Exam by:: Dr. Macon LargeAnyanwu  MVUs adequate since AROM/IUPC placement around 0730; no cervical dilation change since 0033 today. Asynclitic presentation with some fetal head molding. Cervix has significant edema on the right side with a 2-3 cm edematous 'ball' noted; 90% effaced on the left side. Right side is a lot more anterior than the left. Attempted to rotate fetal head position; but fetus had a mild deceleration to 110s which quickly resolved and returned to reactive tracing.  A&P: 22 y.o. G2P0010 5260w1d here for IOL for PD with prolonged active phase/failure of cervical dilation, likely cephalopelvic disproportion.  Counseled patient about examination findings; and concern that vaginal delivery is less probable.  Offered cesarean delivery; patient wants to wait for a couple of hours for reevaluation and re-discussion as she really wants to avoid cesarean section. Reactive FHR tracing for now, no acute concerns or acute indications for cesarean delivery. Will continue close observation, reevaluate in two hours.     Jaynie CollinsUgonna Roderick Calo, MD 2:26 PM

## 2017-12-26 NOTE — Anesthesia Procedure Notes (Signed)
Epidural Patient location during procedure: OB Start time: 12/25/2017 11:58 PM End time: 12/26/2017 12:02 AM  Staffing Anesthesiologist: Beryle LatheBrock, Thomas E, MD Performed: anesthesiologist   Preanesthetic Checklist Completed: patient identified, pre-op evaluation, timeout performed, IV checked, risks and benefits discussed and monitors and equipment checked  Epidural Patient position: sitting Prep: DuraPrep Patient monitoring: continuous pulse ox and blood pressure Approach: midline Location: L2-L3 Injection technique: LOR saline  Needle:  Needle type: Tuohy  Needle gauge: 17 G Needle length: 9 cm Needle insertion depth: 4 cm Catheter size: 19 Gauge Catheter at skin depth: 9 cm Test dose: negative and Other (1% lidocaine)  Additional Notes Patient identified. Risks including, but not limited to, bleeding, infection, nerve damage, paralysis, inadequate analgesia, blood pressure changes, nausea, vomiting, allergic reaction, postpartum back pain, itching, and headache were discussed. Patient expressed understanding and wished to proceed. Sterile prep and drape, including hand hygiene, mask, and sterile gloves were used. The patient was positioned and the spine was prepped. The skin was anesthetized with lidocaine. No paraesthesia or other complication noted. The patient did not experience any signs of intravascular injection such as tinnitus or metallic taste in mouth, nor signs of intrathecal spread such as rapid motor block. Please see nursing notes for vital signs. The patient tolerated the procedure well.   Leslye Peerhomas Brock, MDReason for block:procedure for pain

## 2017-12-26 NOTE — Progress Notes (Signed)
Labor Progress Note Laurie Castro is a 22 y.o. G2P0010 at 8265w1d presented for IOL for postdates.  S: Doing well, just placed epidural.  O:  BP 119/75   Pulse 92   Temp 98.2 F (36.8 C) (Oral)   Resp 16   Ht 5\' 1"  (1.549 m)   Wt 132 lb 9 oz (60.1 kg)   LMP 03/22/2017 (Exact Date)   SpO2 99%   BMI 25.05 kg/m   ONG:EXBMWUXLFHT:baseline rate 120, moderate variability, + acels, no decels Toco: ctx every 2-4 min  CVE: Dilation: 5 Effacement (%): 60, 70 Station: -3 Presentation: Vertex Exam by:: Hermenia BersMichelle Kelly, RN  A&P: 22 y.o. G2P0010 2265w1d here for IOL for PD. Labor: Progressing well. Continue titrating pitocin per protocol. Pain: Epidural FWB: Cat I  Ellwood DenseAlison Rumball, DO 12:28 AM

## 2017-12-27 NOTE — Lactation Note (Signed)
This note was copied from a baby's chart. Lactation Consultation Note  Patient Name: Laurie Castro Reason for consult: Initial assessment;Primapara;Term Breastfeeding consultation services and support information given to patient.  Baby is 1419 hours old.  Mom states baby is latching easily and feeding well.  Instructed to feed with any feeding cue and to call for assist/concerns prn.  Maternal Data    Feeding Feeding Type: Breast Fed  LATCH Score Latch: Grasps breast easily, tongue down, lips flanged, rhythmical sucking.  Audible Swallowing: A few with stimulation  Type of Nipple: Everted at rest and after stimulation  Comfort (Breast/Nipple): Soft / non-tender  Hold (Positioning): Assistance needed to correctly position infant at breast and maintain latch.  LATCH Score: 8  Interventions    Lactation Tools Discussed/Used     Consult Status Consult Status: Follow-up Date: 12/28/17 Follow-up type: In-patient    Huston FoleyMOULDEN, Ludwika Rodd S Castro, 2:37 PM

## 2017-12-27 NOTE — Progress Notes (Addendum)
Patient ID: Laurie Castro, female   DOB: 01/30/1996, 22 y.o.   MRN: [id number] POSTPARTUM PROGRESS NOTE  Post Partum Day #1 Subjective:  Laurie Castro is a 22 y.o. G2P1011 5652w1d s/p SVD following IOL for Postdates.  No acute events overnight.  Pt denies problems with ambulating, voiding or po intake.  She denies nausea or vomiting. Pt endorses a HA since yesterday for which she received tylenol, denies HA this am. No changes in vision. Pain is well controlled.  Lochia Minimal.   Objective: Blood pressure (!) 100/55, pulse 68, temperature 97.6 F (36.4 C), temperature source Oral, resp. rate 18, height 5\' 1"  (1.549 m), weight 60.1 kg (132 lb 9 oz), last menstrual period 03/22/2017, SpO2 97 %, unknown if currently breastfeeding.  Physical Exam:  General: alert, cooperative and no distress Lochia:normal flow Chest: no respiratory distress Heart:regular rate, distal pulses intact Abdomen: soft, nontender Uterine Fundus: firm, appropriately tender DVT Evaluation: No calf swelling or tenderness Extremities: No edema  Recent Labs    12/25/17 0746  HGB 13.5  HCT 37.7    Assessment/Plan:  ASSESSMENT: Laurie L Loder is a 22 y.o. G2P1011 4452w1d s/p SVD following IOL for postdates.  Plan for discharge tomorrow, Breastfeeding, Lactation consult and Contraception POPs   LOS: 2 days   Rolland BimlerLexi J Betancourt, MS3 12/27/2017, 7:44 AM   Midwife attestation Post Partum Day 1 I have seen and examined this patient and agree with above documentation in the student's note.   Laurie Castro is a 22 y.o. G2P1011 s/p SVD.  Pt denies problems with ambulating, voiding or po intake. Pain is well controlled. Method of Feeding: breast  PE:  Gen: well appearing Heart: reg rate Lungs: normal WOB Fundus firm Ext: soft, no pain, no edema  Assessment: S/p SVD PPD #1  Plan for discharge: tomorrow  Laurie Castro, CNM 10:00 AM

## 2017-12-27 NOTE — Anesthesia Postprocedure Evaluation (Signed)
Anesthesia Post Note  Patient: Laurie Castro  Procedure(s) Performed: AN AD HOC LABOR EPIDURAL     Patient location during evaluation: Mother Baby Anesthesia Type: Epidural Level of consciousness: awake and alert Pain management: pain level controlled Vital Signs Assessment: post-procedure vital signs reviewed and stable Respiratory status: spontaneous breathing, nonlabored ventilation and respiratory function stable Cardiovascular status: stable Postop Assessment: no headache, no backache, epidural receding and patient able to bend at knees Anesthetic complications: no    Last Vitals:  Vitals:   12/27/17 0224 12/27/17 0549  BP: 124/82 (!) 100/55  Pulse: 91 68  Resp: 20 18  Temp: 36.9 C 36.4 C  SpO2:      Last Pain:  Vitals:   12/27/17 0549  TempSrc: Oral  PainSc:    Pain Goal:                 Rica RecordsICKELTON,[name]

## 2017-12-28 MED ORDER — IBUPROFEN 600 MG PO TABS: 600.0000 mg | ORAL_TABLET | Freq: Four times a day (QID) | ORAL | 0 refills | Status: DC

## 2017-12-28 NOTE — Lactation Note (Signed)
This note was copied from a baby's chart. Lactation Consultation Note  Patient Name: Boy Dre'Yon Langston MaskerMorris ZOXWR'UToday's Date: 12/28/2017 Reason for consult: Follow-up assessment Mom reports that feedings are going well.  Nipples tender.  Reviewed basics and answered questions.  Discussed milk coming to volume and engorgement prevention and treatment.  Mom has a pump for home use.  Comfort gels given with instructions.  Lactation outpatient services and support reviewed and encouraged prn.  Maternal Data    Feeding Feeding Type: Breast Fed  LATCH Score                   Interventions    Lactation Tools Discussed/Used     Consult Status Consult Status: Complete Follow-up type: Call as needed    Huston FoleyMOULDEN, Makinsey Pepitone S 12/28/2017, 9:15 AM

## 2017-12-28 NOTE — Discharge Summary (Addendum)
OB Discharge Summary     Patient Name: Laurie Castro DOB: [birth year] MRN: [id number]  Date of admission: 12/25/2017 Delivering MD: Lenor Coffin K   Date of discharge: 12/28/2017  Admitting diagnosis: 41wks induction  Intrauterine pregnancy: [redacted]w[redacted]d     Secondary diagnosis:  Active Problems:   Pregnancy   SVD (spontaneous vaginal delivery)  Additional problems: Induction of labor for post dates     Discharge diagnosis: Term Pregnancy Delivered                                                                                                Post partum procedures:n/a  Augmentation: AROM, Pitocin and Foley Balloon  Complications: None  Hospital course:  Induction of Labor With Vaginal Delivery   22 y.o. yo G2P1011 at [redacted]w[redacted]d was admitted to the hospital 12/25/2017 for induction of labor.  Indication for induction: Postdates.  Patient had an uncomplicated labor course as follows: Membrane Rupture Time/Date: 7:00 AM ,12/26/2017   Intrapartum Procedures: Episiotomy: None [1]                                         Lacerations:  Perineal [11]  Patient had delivery of a Viable infant.  Information for the patient's newborn:  Mikiah, Durall Dre'Yon [[id number]]      12/26/2017  Details of delivery can be found in separate delivery note.  Patient had a routine postpartum course. Patient is discharged home 12/28/17.  Physical exam  Vitals:   12/27/17 1048 12/27/17 1425 12/27/17 2326 12/28/17 0622  BP: 107/78 120/76 118/83 112/86  Pulse: 76 68 81 69  Resp: 20 18  18   Temp: 98.4 F (36.9 C) 98 F (36.7 C) 99.1 F (37.3 C) 98 F (36.7 C)  TempSrc:  Oral Oral Oral  SpO2:    100%  Weight:      Height:       General: alert, cooperative and no distress Lochia: appropriate Uterine Fundus: firm Incision:  N/A DVT Evaluation: No evidence of DVT seen on physical exam. Labs: Lab Results  Component Value Date   WBC 12.7 (H) 12/25/2017   HGB 13.5 12/25/2017   HCT 37.7 12/25/2017   MCV  98.7 12/25/2017   PLT 172 12/25/2017   CMP Latest Ref Rng & Units 12/27/2016  Glucose mg/dL CANCELED  BUN - CANCELED  Creatinine - CANCELED  Sodium - CANCELED  Potassium - CANCELED  Chloride - CANCELED  CO2 - CANCELED  Calcium - CANCELED  Total Protein - CANCELED  Total Bilirubin - CANCELED  Alkaline Phos - CANCELED  AST - CANCELED  ALT - CANCELED    Discharge instruction: per After Visit Summary and "Baby and Me Booklet".  After visit meds:  Allergies as of 12/28/2017   No Known Allergies     Medication List    STOP taking these medications   acetaminophen 500 MG tablet Commonly known as:  TYLENOL   PRENATAL VITAMINS PO     TAKE these medications   ibuprofen  600 MG tablet Commonly known as:  ADVIL,MOTRIN Take 1 tablet (600 mg total) by mouth every 6 (six) hours.       Diet: routine diet  Activity: Advance as tolerated. Pelvic rest for 6 weeks.   Outpatient follow up:6 weeks Follow up Appt:No future appointments. Follow up Visit: Follow-up Information    [facility]   Specialty:  Obstetrics and Gynecology Contact information: [address] [place] [id number] [phone]          Postpartum contraception: Progesterone only pills  Newborn Data: Live born female  Birth Weight: 8 lb 6.2 oz (3805 g) APGAR: 7, 9  Newborn Delivery   Birth date/time:  12/26/2017 19:05:00 Delivery type:  Vaginal, Spontaneous     Baby Feeding: Breast Disposition:home with mother   12/28/2017 Sandre Kittyaniel K Olson, MD  OB FELLOW DISCHARGE ATTESTATION  I have seen and examined this patient. I agree with above documentation and have made edits as needed.   Caryl AdaJazma Felina Tello, DO

## 2017-12-28 NOTE — Discharge Instructions (Signed)
Vaginal Delivery, Care After °Refer to this sheet in the next few weeks. These instructions provide you with information about caring for yourself after vaginal delivery. Your health care provider may also give you more specific instructions. Your treatment has been planned according to current medical practices, but problems sometimes occur. Call your health care provider if you have any problems or questions. °What can I expect after the procedure? °After vaginal delivery, it is common to have: °· Some bleeding from your vagina. °· Soreness in your abdomen, your vagina, and the area of skin between your vaginal opening and your anus (perineum). °· Pelvic cramps. °· Fatigue. ° °Follow these instructions at home: °Medicines °· Take over-the-counter and prescription medicines only as told by your health care provider. °· If you were prescribed an antibiotic medicine, take it as told by your health care provider. Do not stop taking the antibiotic until it is finished. °Driving ° °· Do not drive or operate heavy machinery while taking prescription pain medicine. °· Do not drive for 24 hours if you received a sedative. °Lifestyle °· Do not drink alcohol. This is especially important if you are breastfeeding or taking medicine to relieve pain. °· Do not use tobacco products, including cigarettes, chewing tobacco, or e-cigarettes. If you need help quitting, ask your health care provider. °Eating and drinking °· Drink at least 8 eight-ounce glasses of water every day unless you are told not to by your health care provider. If you choose to breastfeed your baby, you may need to drink more water than this. °· Eat high-fiber foods every day. These foods may help prevent or relieve constipation. High-fiber foods include: °? Whole grain cereals and breads. °? Brown rice. °? Beans. °? Fresh fruits and vegetables. °Activity °· Return to your normal activities as told by your health care provider. Ask your health care provider  what activities are safe for you. °· Rest as much as possible. Try to rest or take a nap when your baby is sleeping. °· Do not lift anything that is heavier than your baby or 10 lb (4.5 kg) until your health care provider says that it is safe. °· Talk with your health care provider about when you can engage in sexual activity. This may depend on your: °? Risk of infection. °? Rate of healing. °? Comfort and desire to engage in sexual activity. °Vaginal Care °· If you have an episiotomy or a vaginal tear, check the area every day for signs of infection. Check for: °? More redness, swelling, or pain. °? More fluid or blood. °? Warmth. °? Pus or a bad smell. °· Do not use tampons or douches until your health care provider says this is safe. °· Watch for any blood clots that may pass from your vagina. These may look like clumps of dark red, brown, or black discharge. °General instructions °· Keep your perineum clean and dry as told by your health care provider. °· Wear loose, comfortable clothing. °· Wipe from front to back when you use the toilet. °· Ask your health care provider if you can shower or take a bath. If you had an episiotomy or a perineal tear during labor and delivery, your health care provider may tell you not to take baths for a certain length of time. °· Wear a bra that supports your breasts and fits you well. °· If possible, have someone help you with household activities and help care for your baby for at least a few days after   you leave the hospital. °· Keep all follow-up visits for you and your baby as told by your health care provider. This is important. °Contact a health care provider if: °· You have: °? Vaginal discharge that has a bad smell. °? Difficulty urinating. °? Pain when urinating. °? A sudden increase or decrease in the frequency of your bowel movements. °? More redness, swelling, or pain around your episiotomy or vaginal tear. °? More fluid or blood coming from your episiotomy or  vaginal tear. °? Pus or a bad smell coming from your episiotomy or vaginal tear. °? A fever. °? A rash. °? Little or no interest in activities you used to enjoy. °? Questions about caring for yourself or your baby. °· Your episiotomy or vaginal tear feels warm to the touch. °· Your episiotomy or vaginal tear is separating or does not appear to be healing. °· Your breasts are painful, hard, or turn red. °· You feel unusually sad or worried. °· You feel nauseous or you vomit. °· You pass large blood clots from your vagina. If you pass a blood clot from your vagina, save it to show to your health care provider. Do not flush blood clots down the toilet without having your health care provider look at them. °· You urinate more than usual. °· You are dizzy or light-headed. °· You have not breastfed at all and you have not had a menstrual period for 12 weeks after delivery. °· You have stopped breastfeeding and you have not had a menstrual period for 12 weeks after you stopped breastfeeding. °Get help right away if: °· You have: °? Pain that does not go away or does not get better with medicine. °? Chest pain. °? Difficulty breathing. °? Blurred vision or spots in your vision. °? Thoughts about hurting yourself or your baby. °· You develop pain in your abdomen or in one of your legs. °· You develop a severe headache. °· You faint. °· You bleed from your vagina so much that you fill two sanitary pads in one hour. °This information is not intended to replace advice given to you by your health care provider. Make sure you discuss any questions you have with your health care provider. °Document Released: 05/31/2000 Document Revised: 11/15/2015 Document Reviewed: 06/18/2015 °Elsevier Interactive Patient Education © 2018 [employer] ° °

## 2018-01-26 ENCOUNTER — Other Ambulatory Visit (HOSPITAL_COMMUNITY)
Admission: RE | Admit: 2018-01-26 | Discharge: 2018-01-26 | Disposition: A | Discharge status: Home / Self Care | Payer: 59 | Source: Ambulatory Visit | Attending: Advanced Practice Midwife | Admitting: Advanced Practice Midwife

## 2018-01-26 ENCOUNTER — Ambulatory Visit (INDEPENDENT_AMBULATORY_CARE_PROVIDER_SITE_OTHER): Payer: 59

## 2018-01-26 DIAGNOSIS — N898 Other specified noninflammatory disorders of vagina: Secondary | ICD-10-CM | POA: Insufficient documentation

## 2018-01-26 NOTE — Progress Notes (Signed)
Pt here today for Self swab with possible BV. Informed pt on how to self swab and, pt verbalized understanding and specimen received. Advised pt to keep PP appointment.

## 2018-01-26 NOTE — Progress Notes (Signed)
   I have reviewed the chart and agree with nursing staff's documentation of this patient's encounter.  Thressa ShellerHeather Hogan, CNM 01/26/2018 4:09 PM

## 2018-01-27 LAB — CERVICOVAGINAL ANCILLARY ONLY
BACTERIAL VAGINITIS: POSITIVE — AB
CHLAMYDIA, DNA PROBE: NEGATIVE
Candida vaginitis: NEGATIVE
NEISSERIA GONORRHEA: NEGATIVE

## 2018-01-29 ENCOUNTER — Telehealth: Payer: Self-pay | Admitting: General Practice

## 2018-01-29 DIAGNOSIS — N760 Acute vaginitis: Principal | ICD-10-CM

## 2018-01-29 DIAGNOSIS — B9689 Other specified bacterial agents as the cause of diseases classified elsewhere: Secondary | ICD-10-CM

## 2018-01-29 MED ORDER — METRONIDAZOLE 500 MG PO TABS: 500.0000 mg | ORAL_TABLET | Freq: Two times a day (BID) | ORAL | 0 refills | Status: DC

## 2018-01-29 NOTE — Telephone Encounter (Signed)
Patient called and left message on nurse voicemail line requesting results. Called back & informed her of results & medication sent to pharmacy. Patient verbalized understanding & states she usually needs two rounds of medication for treatment. Discussed with patient trying this first round and seeing how things go. Told patient if it is still a problem she can discuss with the provider at her follow up visit on 8/26. Patient verbalized understanding & had no questions.

## 2018-02-09 ENCOUNTER — Encounter: Payer: Self-pay | Admitting: Advanced Practice Midwife

## 2018-02-09 ENCOUNTER — Other Ambulatory Visit (HOSPITAL_COMMUNITY)
Admission: RE | Admit: 2018-02-09 | Discharge: 2018-02-09 | Disposition: A | Discharge status: Home / Self Care | Payer: 59 | Source: Ambulatory Visit | Attending: Advanced Practice Midwife | Admitting: Advanced Practice Midwife

## 2018-02-09 ENCOUNTER — Ambulatory Visit (INDEPENDENT_AMBULATORY_CARE_PROVIDER_SITE_OTHER): Payer: 59 | Admitting: Advanced Practice Midwife

## 2018-02-09 VITALS — BP 104/73 | HR 79 | Wt 112.8 lb

## 2018-02-09 DIAGNOSIS — Z1389 Encounter for screening for other disorder: Secondary | ICD-10-CM

## 2018-02-09 DIAGNOSIS — N898 Other specified noninflammatory disorders of vagina: Secondary | ICD-10-CM | POA: Insufficient documentation

## 2018-02-09 MED ORDER — NORETHINDRONE 0.35 MG PO TABS
1.0000 | ORAL_TABLET | Freq: Every day | ORAL | 11 refills | Status: DC
Start: 2018-02-09 — End: 2022-12-26

## 2018-02-09 NOTE — Progress Notes (Signed)
Subjective:     Laurie Castro is a 22 y.o. female who presents for a postpartum visit. She is 7 weeks postpartum following a spontaneous vaginal delivery. I have fully reviewed the prenatal and intrapartum course. The delivery was at 41.1 gestational weeks. Outcome: spontaneous vaginal delivery. Anesthesia: epidural. Postpartum course has been unremarkable. Baby's course has been Unremarkable. Baby is feeding by breast. Bleeding staining only. Bowel function is normal. Bladder function is normal. Patient is not sexually active. Contraception method is oral progesterone-only contraceptive. Postpartum depression screening: negative.  The following portions of the patient's history were reviewed and updated as appropriate: allergies, current medications, past family history, past medical history, past social history, past surgical history and problem list.  Review of Systems Pertinent items are noted in HPI.   Objective:    LMP 03/22/2017 (Exact Date)   General:  alert and cooperative   Breasts:  negative  Lungs: clear to auscultation bilaterally  Heart:  regular rate and rhythm, S1, S2 normal, no murmur, click, rub or gallop  Abdomen: soft, non-tender; bowel sounds normal; no masses,  no organomegaly   Vulva:  normal  Vagina: normal vagina  Cervix:  not examined   Corpus: normal  Adnexa:  normal adnexa  Rectal Exam: Not performed.        Assessment:     routine postpartum exam. Pap smear not done at today's visit.   Plan:    1. Contraception: oral progesterone-only contraceptive 2. Wet prep collected, will call if abnormal  3. Follow up in: 1 year or as needed.   4. Patient ok to send mychart message if she needs to change to COCs when baby weans.

## 2018-02-10 LAB — CERVICOVAGINAL ANCILLARY ONLY
BACTERIAL VAGINITIS: POSITIVE — AB
CANDIDA VAGINITIS: NEGATIVE
CHLAMYDIA, DNA PROBE: NEGATIVE
NEISSERIA GONORRHEA: NEGATIVE
Trichomonas: NEGATIVE

## 2018-02-11 MED ORDER — METRONIDAZOLE 500 MG PO TABS: 500.0000 mg | ORAL_TABLET | Freq: Two times a day (BID) | ORAL | 0 refills | Status: DC

## 2018-02-11 NOTE — Addendum Note (Signed)
Addended by: Thressa ShellerHOGAN, Antonio Woodhams D on: 02/11/2018 03:56 PM   Modules accepted: Orders

## 2019-01-30 IMAGING — US US MFM OB COMP +14 WKS
1 series · 13 of 28 positions shown · non-contrast
Comparison: none

[Series 1: us mfm ob comp +14 wks · 13 of 60 slices shown]
[im 3/60]
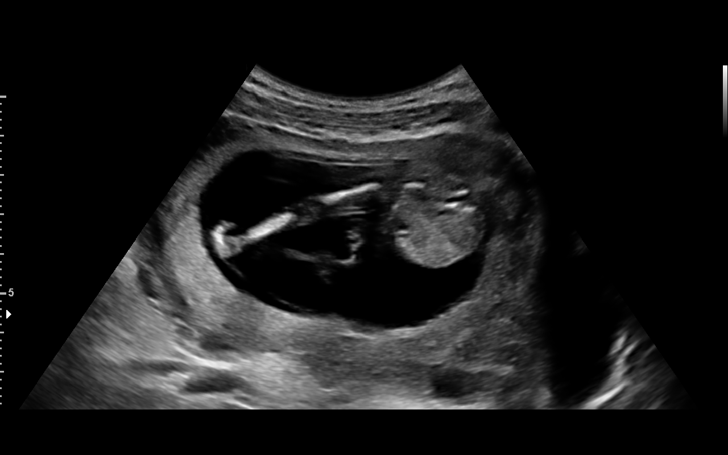
[im 7/60]
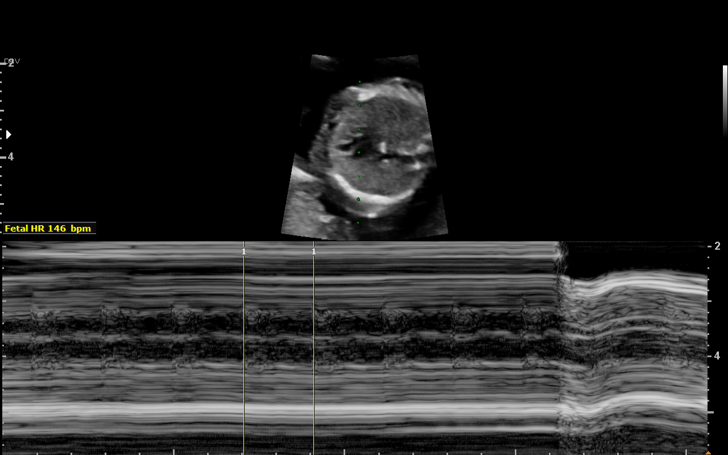
[im 11/60]
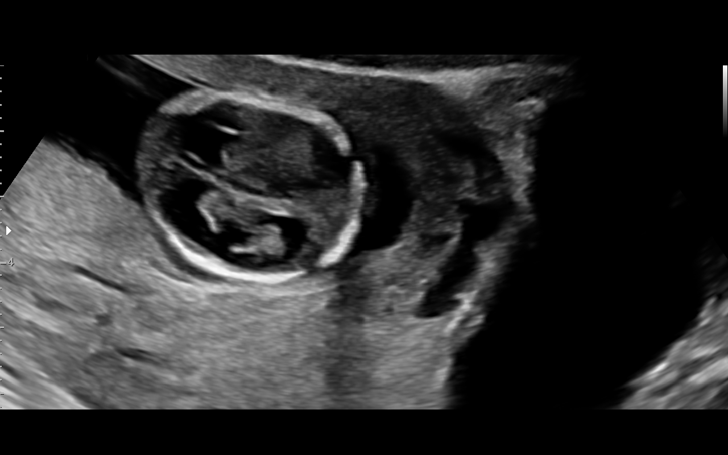
[im 16/60]
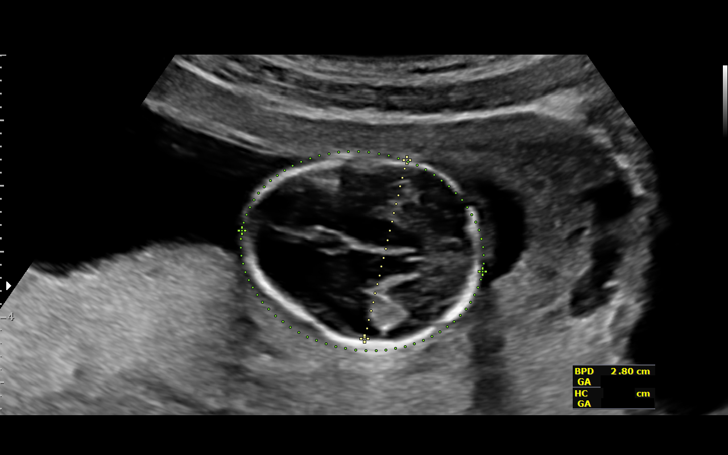
[im 20/60]
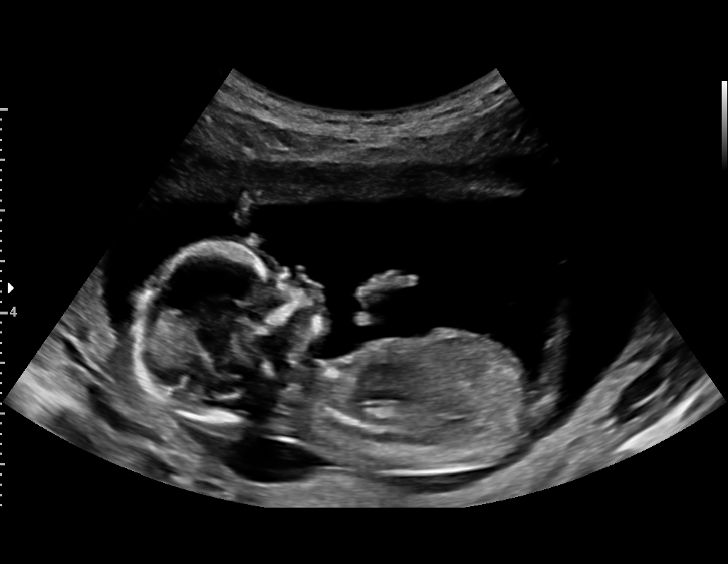
[im 25/60]
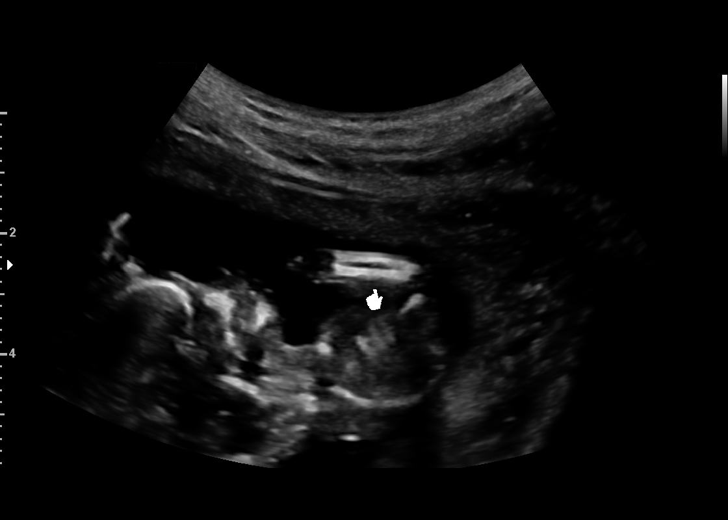
[im 31/60]
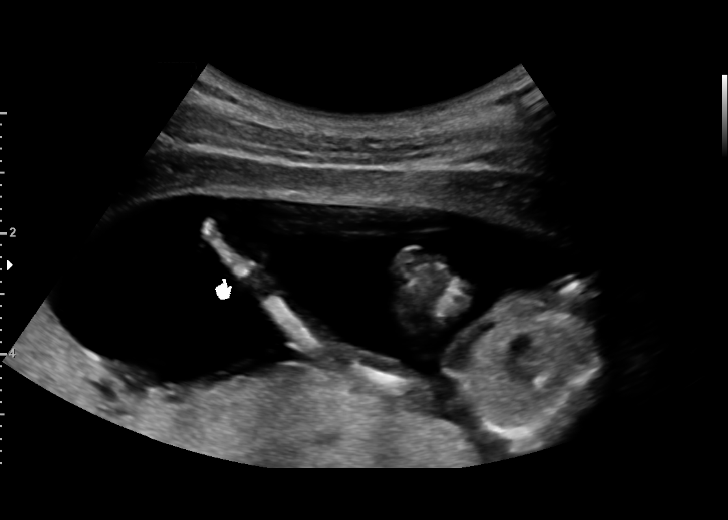
[im 35/60]
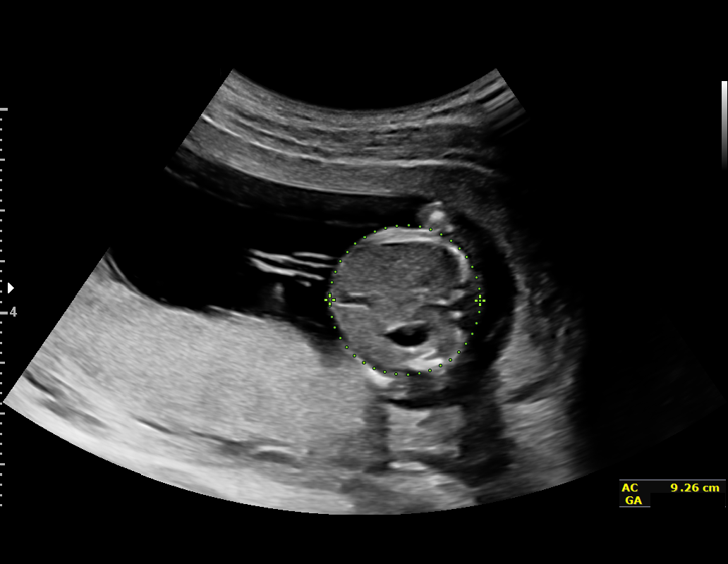
[im 40/60]
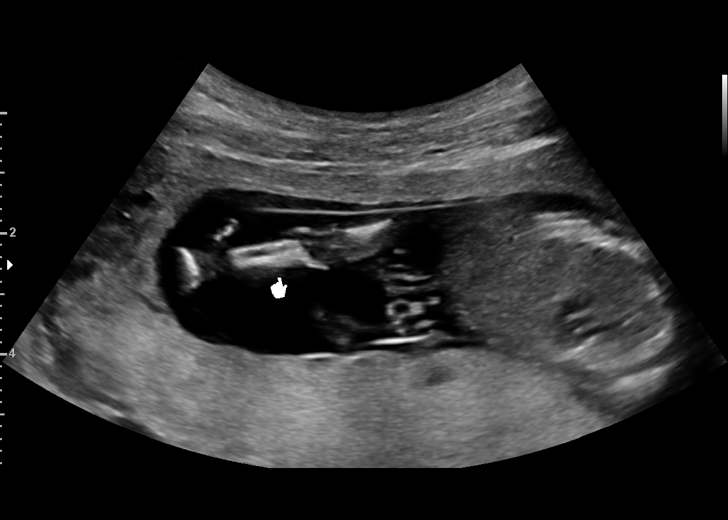
[im 44/60]
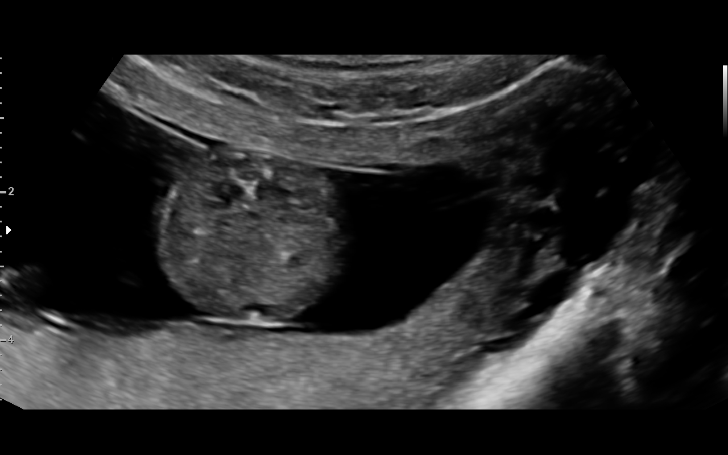
[im 49/60]
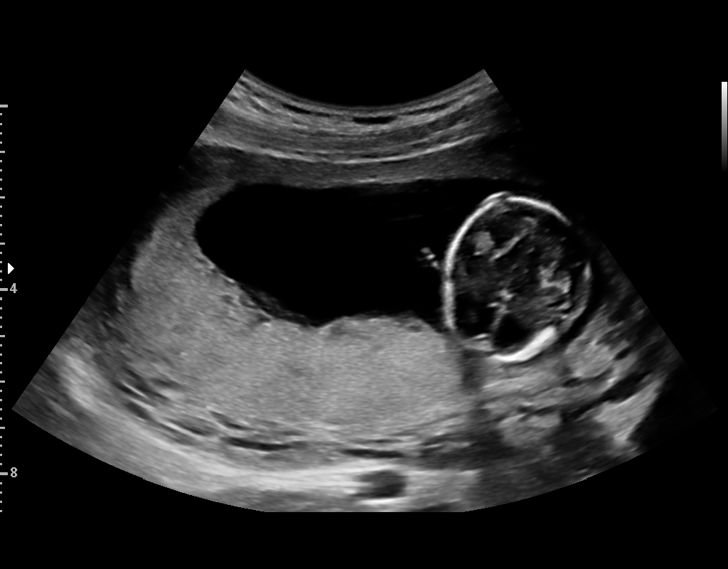
[im 53/60]
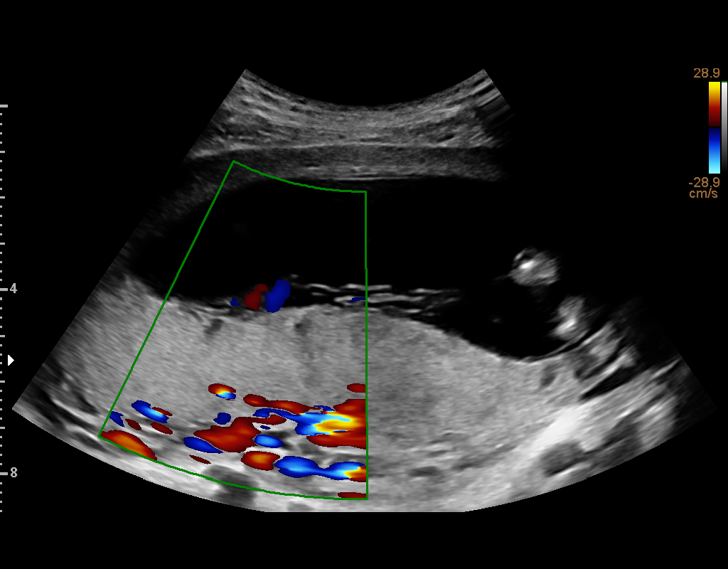
[im 57/60]
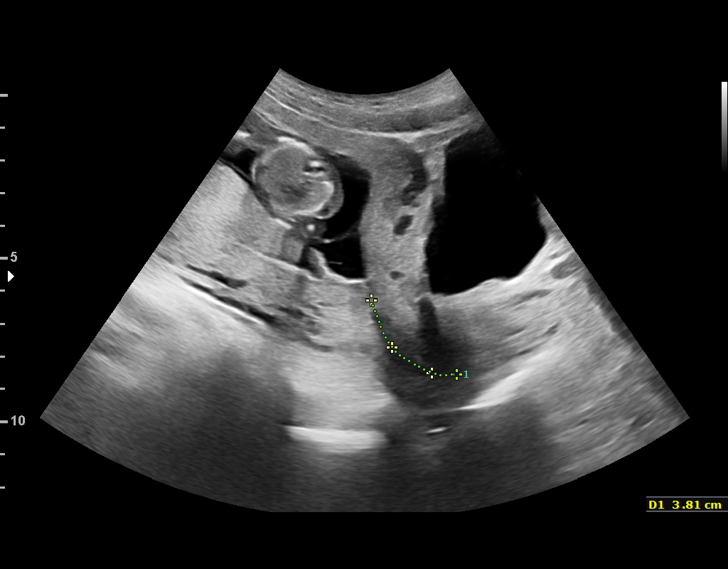

[13 of 28 positions shown; findings below may reference images not displayed]

1  KITANI SUIS             [id number]      [id number]     [id number]
Indications

14 weeks gestation of pregnancy
Encounter for antenatal screening for
malformations
OB History

Blood Type:            Height:  5'1"   Weight (lb):  98        BMI:
Gravidity:    2
TOP:          1
Fetal Evaluation

Num Of Fetuses:     1
Fetal Heart         146
Rate(bpm):
Cardiac Activity:   Observed
Presentation:       Breech
Placenta:           Posterior
P. Cord Insertion:  Visualized

Amniotic Fluid
AFI FV:      Subjectively within normal limits

Largest Pocket(cm)
2.79
Biometry

BPD:      27.6  mm     G. Age:  14w 6d                  CI:        70.27   %    70 - 86
FL/HC:      15.0   %    15.3 -
HC:       105   mm     G. Age:  15w 0d                  HC/AC:      1.17        1.05 -
AC:       89.4  mm     G. Age:  15w 1d                  FL/BPD:     56.9   %
FL:       15.7  mm     G. Age:  14w 4d                  FL/AC:      17.6   %    20 - 24
HUM:        16  mm     G. Age:  14w 4d
Est. FW:     110  gm      0 lb 4 oz
Gestational Age

LMP:           13w 4d        Date:  03/22/17                 EDD:   12/27/17
U/S Today:     14w 6d                                        EDD:   12/18/17
Best:          14w 6d     Det. By:  U/S (06/25/17)           EDD:   12/18/17
Anatomy

Cranium:               Appears normal         Cord Vessels:           Appears normal (3
vessel cord)
Cavum:                 Not well visualized    Kidneys:                Appear normal
Choroid Plexus:        Left choroid           Bladder:                Appears normal
plexus cyst, 5 mm
Face:                  Profile appears        Upper Extremities:      Appears normal
normal
Stomach:               Appears normal, left   Lower Extremities:      Appears normal
sided
Abdomen:               Appears normal

Other:  Technically difficult due to early gestational age. Nasal bone
visualized. Open hands visualized.
Cervix Uterus Adnexa

Cervix
Normal appearance by transabdominal scan.
Impression

Singleton intrauterine pregnancy at 14+6 weeks by US today;
unable to perform first trimester screen
Review of the anatomy shows no structural anomalies; there
is what may be a isolated choroid plexus cyst noted
However, evaluations should be considered suboptimal
secondary to early gestational age
Amniotic fluid volume is normal
Estimated fetal weight is 110g which is normal growth for this
EGA
Recommendations

Did not discuss the CPC as it is isolated. Would recommend
maternal serum marker screening, and if this is abnormal
please refer for genetic counseling. If normal, an isolated
CPC does not increase risk for aneuploidy.
Consider repeat scan in 4-6 weeks to complete anatomic
survey

## 2019-02-27 IMAGING — US US MFM OB COMP +14 WKS
1 series · 14 of 28 positions shown · non-contrast
Comparison: none

[Series 1: us mfm ob comp +14 wks · 116 acquisitions, 14 frames shown]
[im 5/116]
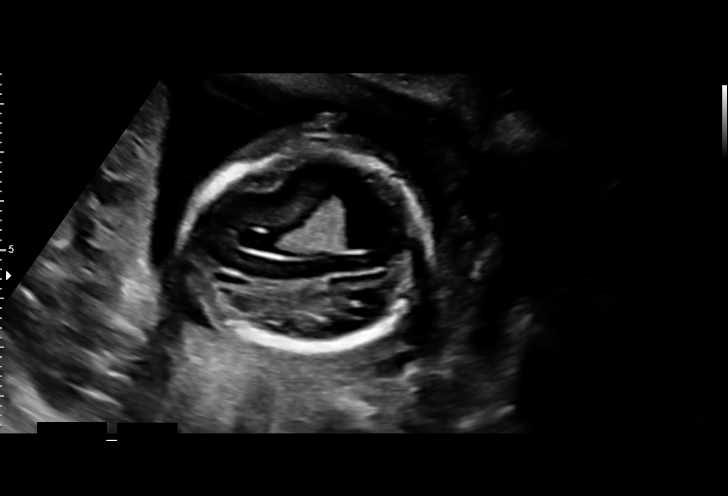
[im 13/116]
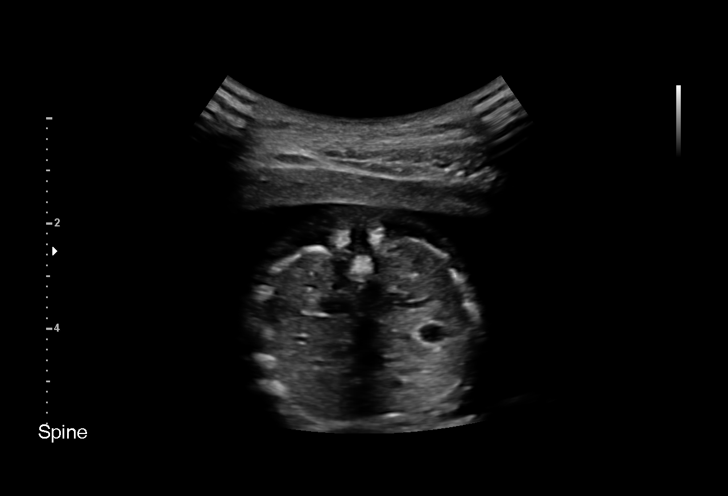
[im 22/116]
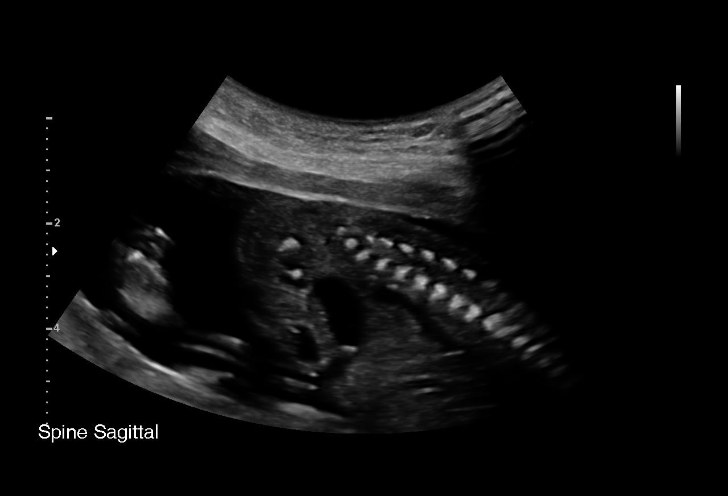
[im 30/116]
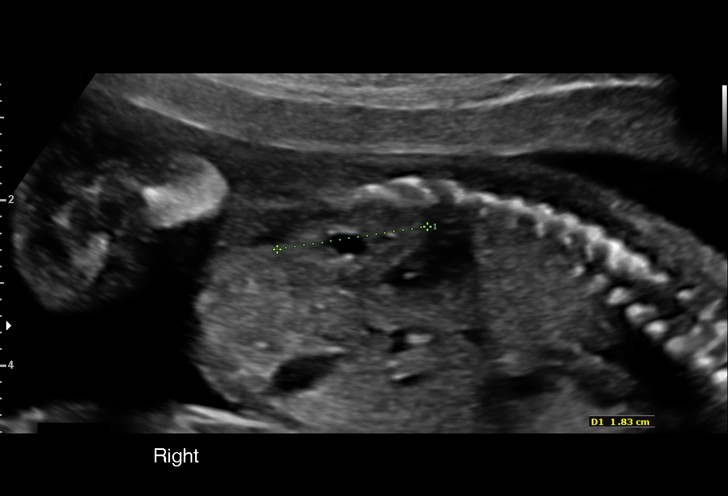
[im 39/116]
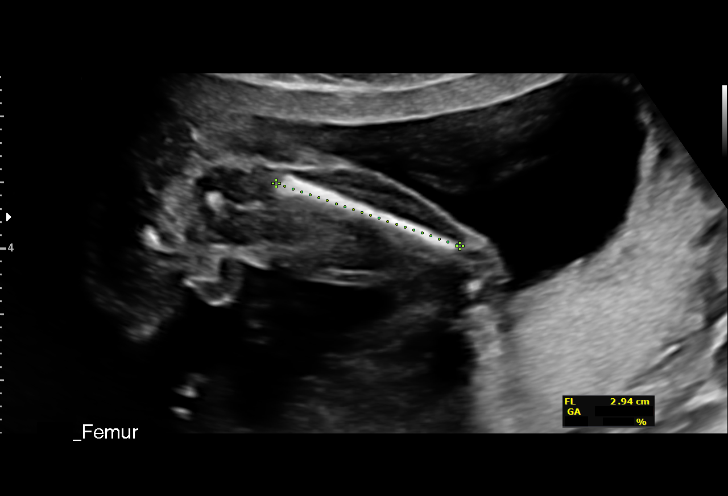
[im 47/116]
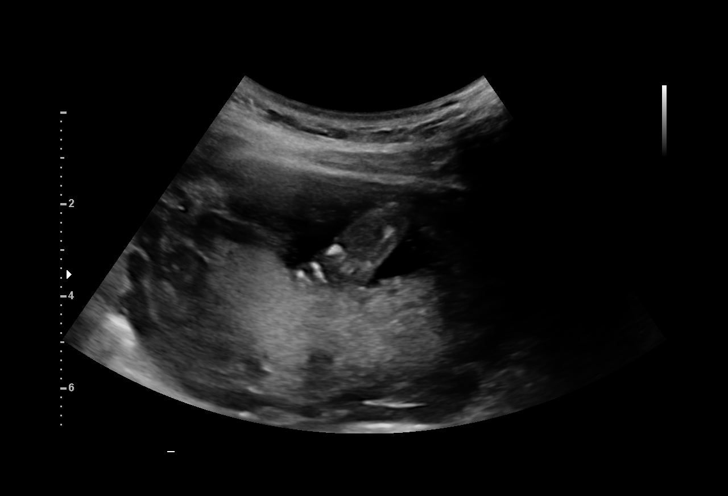
[im 56/116]
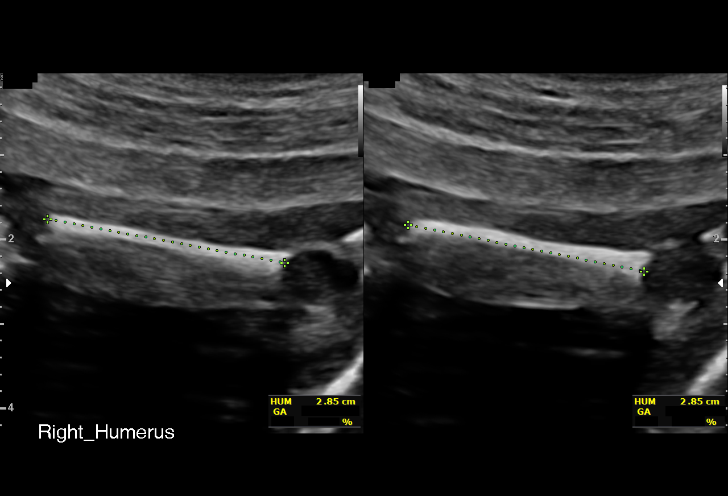
[im 64/116]
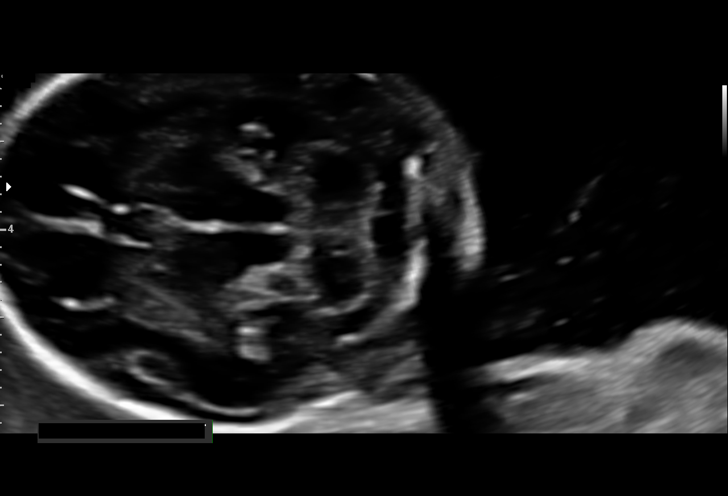
[im 73/116]
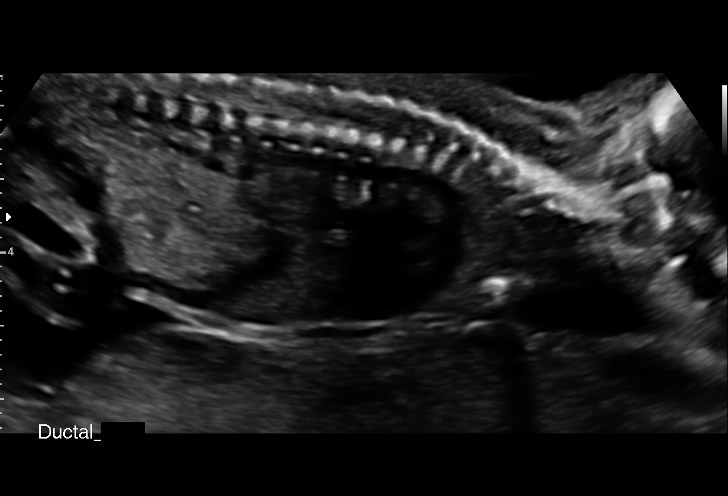
[im 81/116]
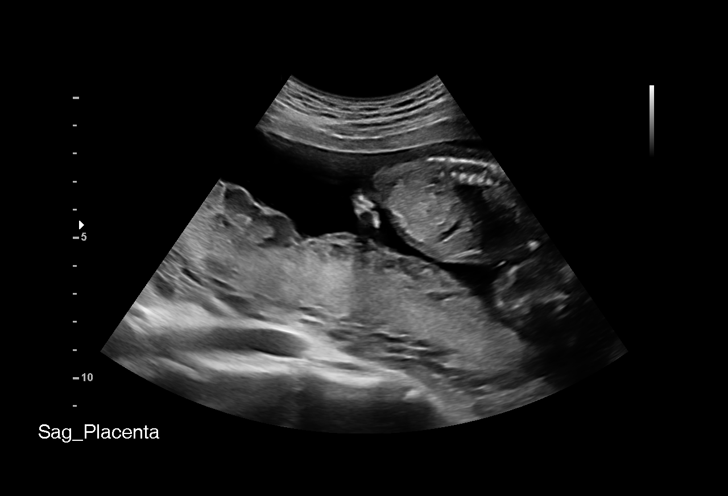
[im 90/116]
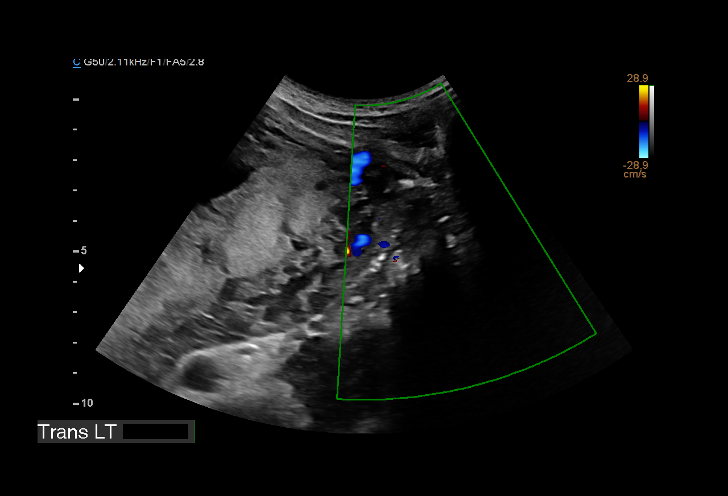
[im 98/116]
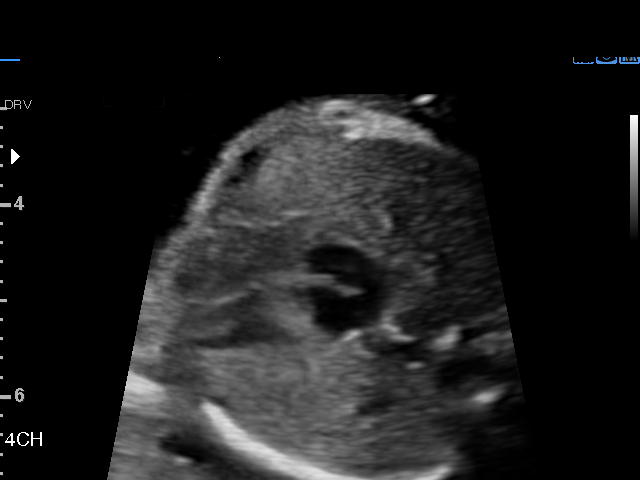
[im 107/116]
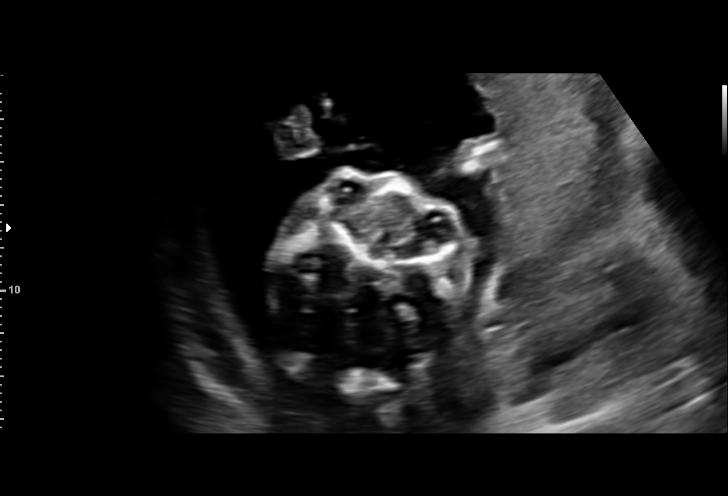
[im 116/116]
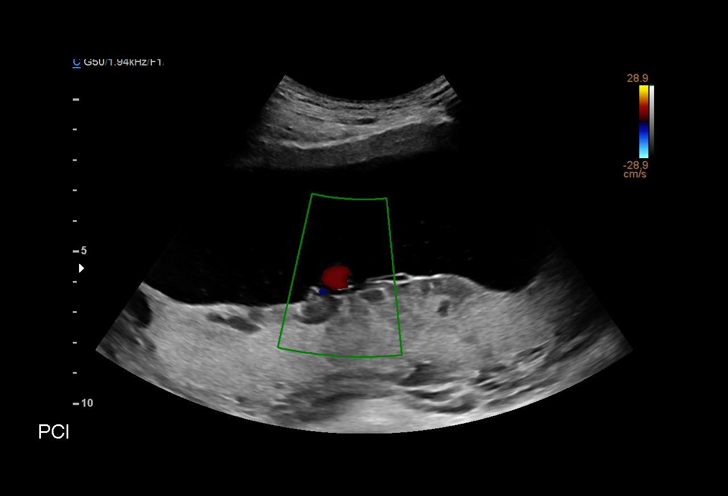

[14 of 28 positions shown; findings below may reference images not displayed]

1  LABELLE SALHA BRACK TIGER           [id number]      [id number]     [id number]
Indications

18 weeks gestation of pregnancy
Encounter for antenatal screening for
malformations
OB History

Blood Type:            Height:  5'1"   Weight (lb):  98        BMI:
Gravidity:    2
TOP:          1
Fetal Evaluation

Num Of Fetuses:     1
Fetal Heart         144
Rate(bpm):
Cardiac Activity:   Observed
Presentation:       Cephalic
Placenta:           Posterior, above cervical os
P. Cord Insertion:  Visualized

Amniotic Fluid
AFI FV:      Subjectively within normal limits

Largest Pocket(cm)
3.89
Biometry

BPD:      46.3  mm     G. Age:  20w 0d         90  %    CI:        78.07   %    70 - 86
FL/HC:      17.6   %    16.1 -
HC:      165.8  mm     G. Age:  19w 2d         63  %    HC/AC:      1.15        1.09 -
AC:      144.4  mm     G. Age:  19w 5d         76  %    FL/BPD:     62.9   %
FL:       29.1  mm     G. Age:  19w 0d         47  %    FL/AC:      20.2   %    20 - 24
HUM:      28.6  mm     G. Age:  19w 2d         62  %
CER:      20.3  mm     G. Age:  19w 2d         62  %
NFT:       4.1  mm

CM:        4.3  mm

Est. FW:     290  gm    0 lb 10 oz      54  %
Gestational Age

LMP:           17w 4d        Date:  03/22/17                 EDD:   12/27/17
U/S Today:     19w 4d                                        EDD:   12/13/17
Best:          18w 6d     Det. By:  U/S  (06/25/17)          EDD:   12/18/17
Anatomy

Cranium:               Appears normal         Aortic Arch:            Appears normal
Cavum:                 Appears normal         Ductal Arch:            Appears normal
Ventricles:            Appears normal         Diaphragm:              Not well visualized
Choroid Plexus:        Appears normal         Stomach:                Appears normal, left
sided
Cerebellum:            Appears normal         Abdomen:                Appears normal
Posterior Fossa:       Appears normal         Abdominal Wall:         Appears nml (cord
insert, abd wall)
Nuchal Fold:           Appears normal         Cord Vessels:           Appears normal (3
vessel cord)
Face:                  Appears normal         Kidneys:                Appear normal
(orbits and profile)
Lips:                  Appears normal         Bladder:                Appears normal
Thoracic:              Appears normal         Spine:                  Appears normal
Heart:                 Appears normal         Upper Extremities:      Appears normal
(4CH, axis, and
situs)
RVOT:                  Not well visualized    Lower Extremities:      Appears normal
LVOT:                  Appears normal

Other:  Fetus appears to be a male. Heels visualized. Technically difficult due
to fetal position.
Cervix Uterus Adnexa

Cervix
Length:           3.41  cm.
Normal appearance by transabdominal scan.

Uterus
No abnormality visualized.

Left Ovary
Not visualized.

Right Ovary
Size(cm)       2.6  x   1.4    x  1         Vol(ml):
Within normal limits.
Impression

SIUP at 24w0d
active fetus
EFW 54th%'le
no defects seen
limited views as above
no previa
amniotic fluid is gestational age appropriate
cervix is long and closed
Recommendations

Recommend interval growth and attempt to complete survey
in 6-8 weeks.

## 2019-04-03 IMAGING — US US MFM OB FOLLOW-UP
1 series · 14 of 28 positions shown · non-contrast
Comparison: none

[Series 1: us mfm ob follow-up · 65 acquisitions, 14 frames shown]
[im 3/65]
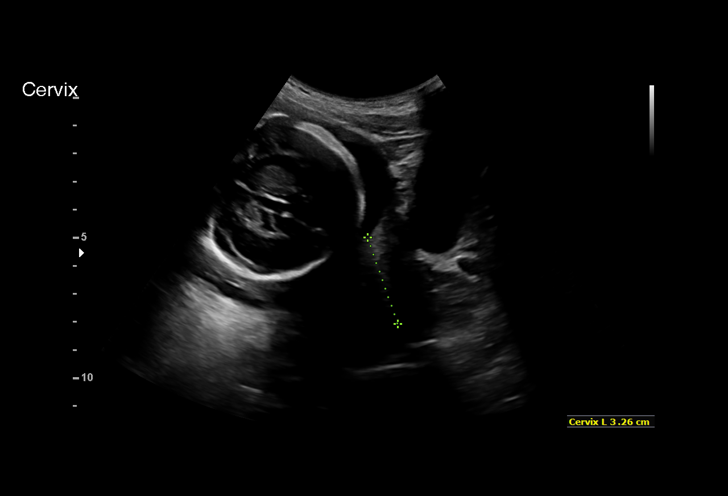
[im 8/65]
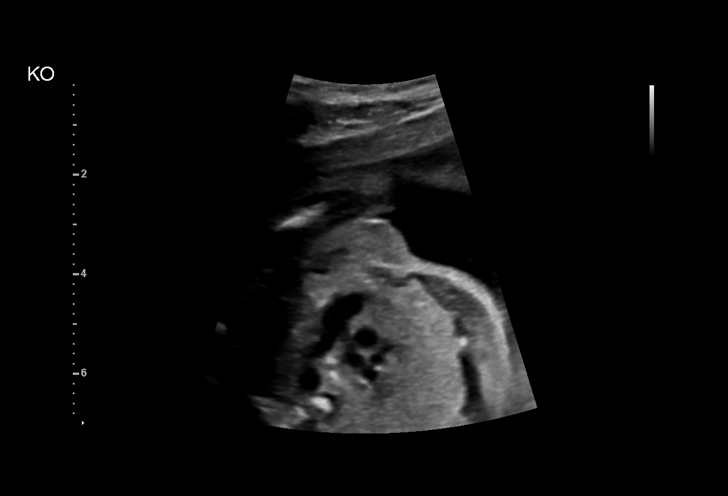
[im 12/65]
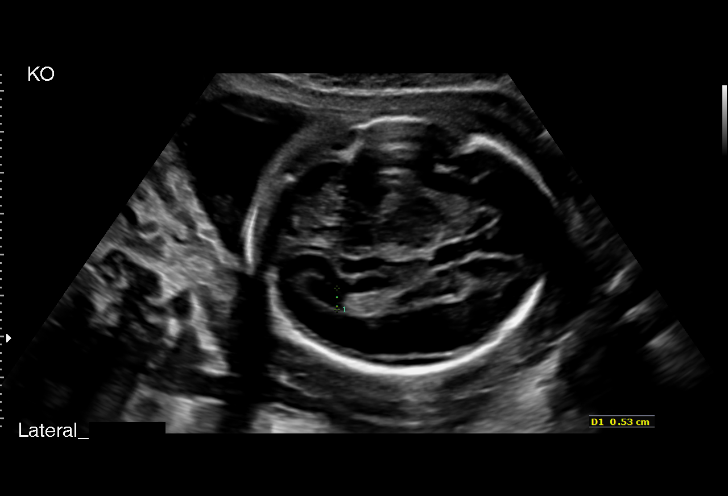
[im 17/65]
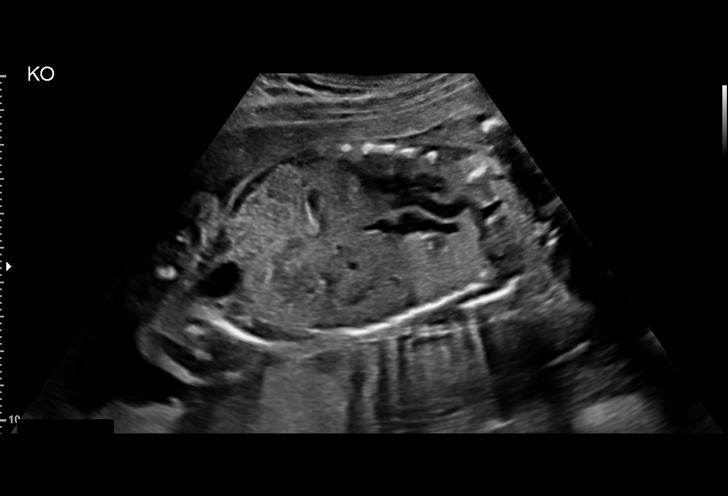
[im 22/65]
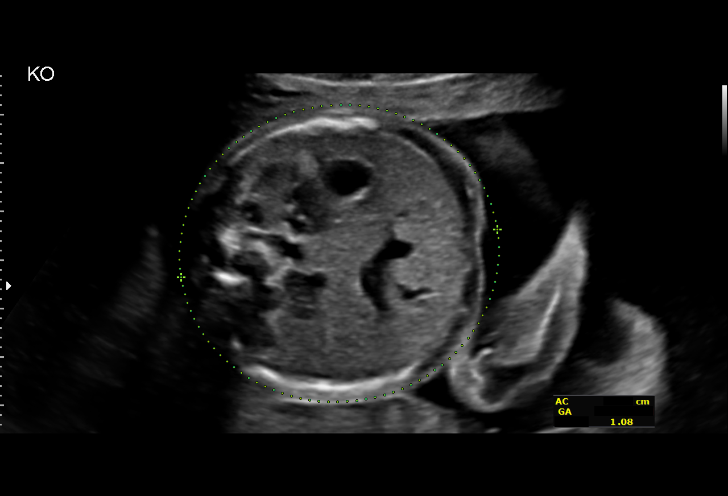
[im 27/65]
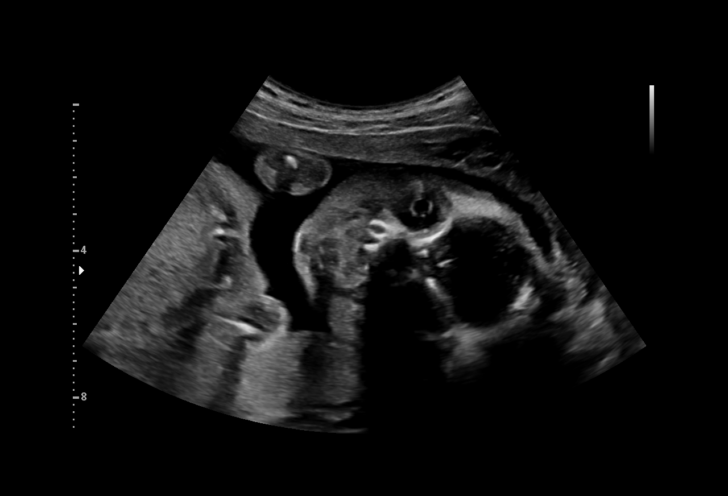
[im 31/65]
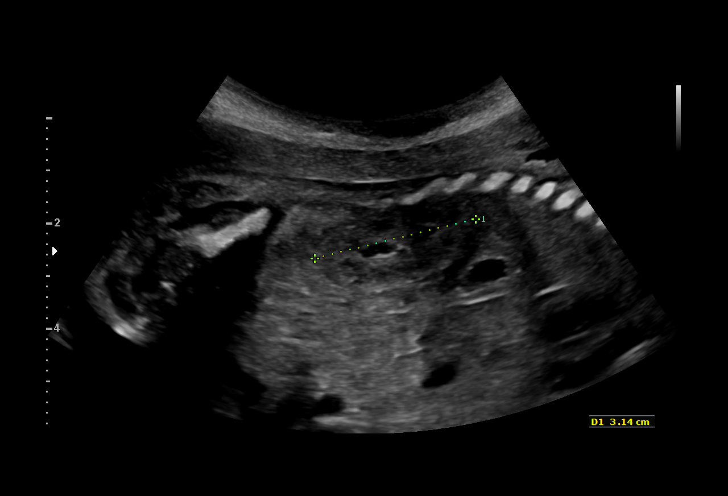
[im 36/65]
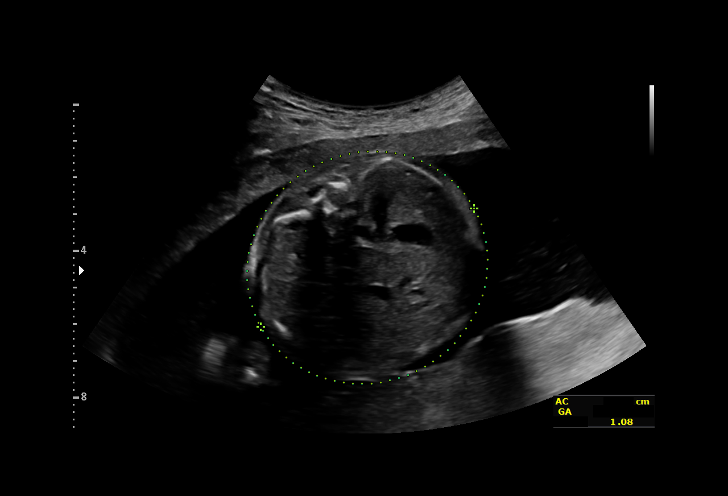
[im 41/65]
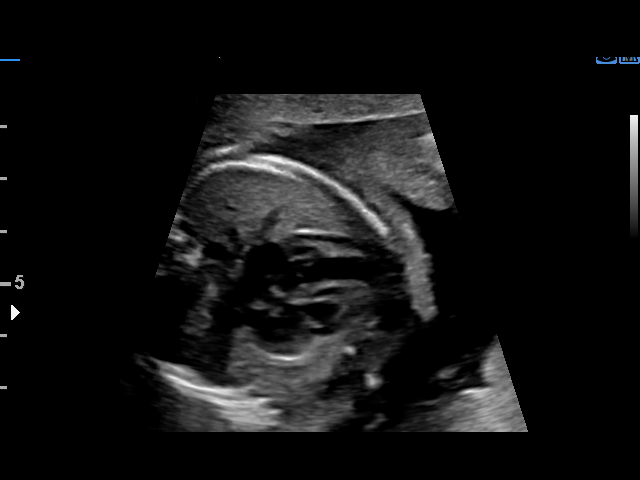
[im 46/65]
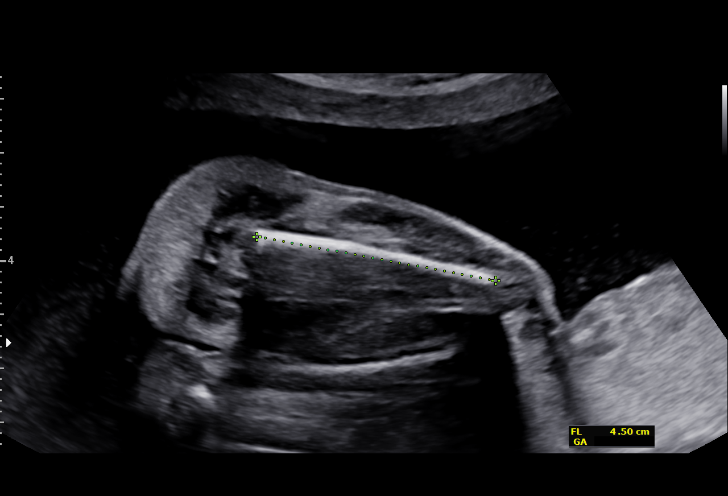
[im 50/65]
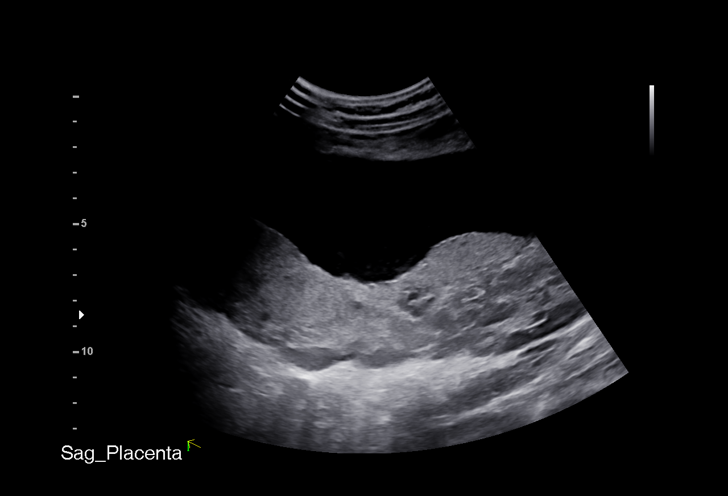
[im 55/65]
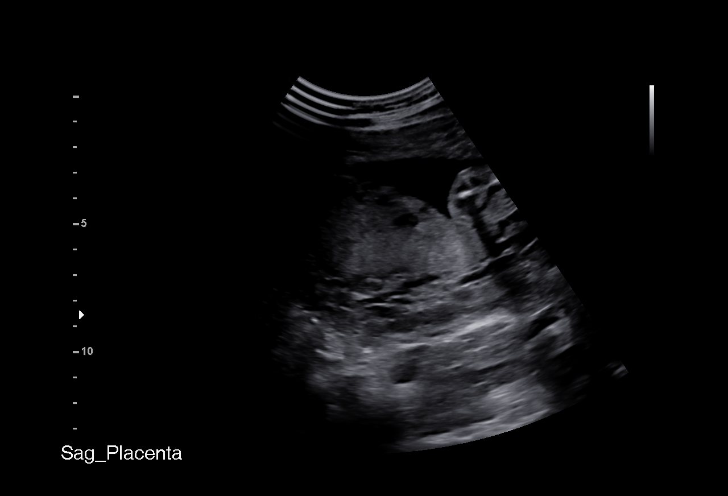
[im 60/65]
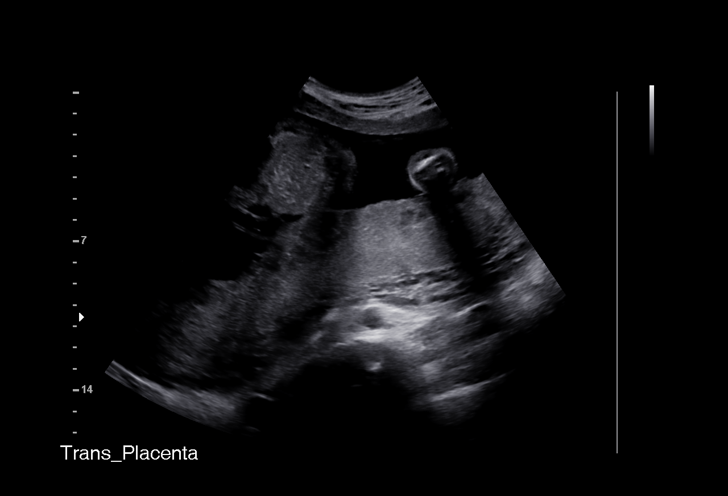
[im 65/65]
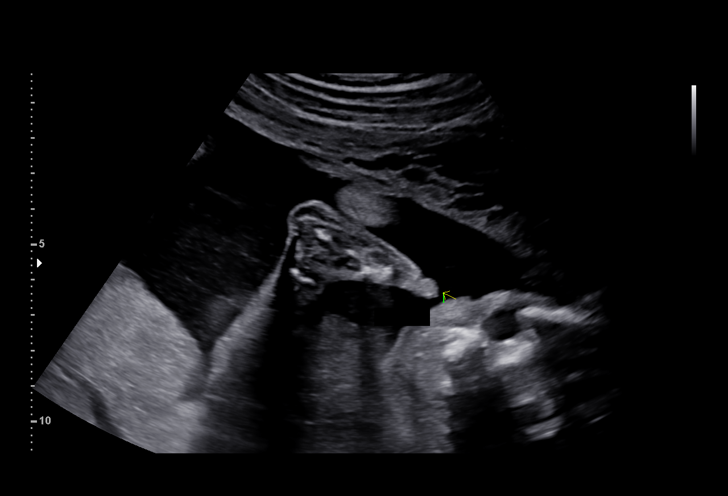

[14 of 28 positions shown; findings below may reference images not displayed]

1  KUPI PRODAJ ZAMJENI CIVIDINI             [id number]      [id number]     [id number]
Indications

23 weeks gestation of pregnancy
Encounter for other antenatal screening
follow-up
OB History

Blood Type:            Height:  5'1"   Weight (lb):  98        BMI:
Gravidity:    2
TOP:          1
Fetal Evaluation

Num Of Fetuses:     1
Fetal Heart         125
Rate(bpm):
Cardiac Activity:   Observed
Presentation:       Cephalic
Placenta:           Anterior, above cervical os
P. Cord Insertion:  Visualized, central

Amniotic Fluid
AFI FV:      Subjectively within normal limits

Largest Pocket(cm)
5.48
Biometry

BPD:      60.4  mm     G. Age:  24w 4d         71  %    CI:        76.74   %    70 - 86
FL/HC:      20.4   %    18.7 -
HC:      218.4  mm     G. Age:  23w 6d         34  %    HC/AC:      1.10        1.05 -
AC:      199.4  mm     G. Age:  24w 4d         64  %    FL/BPD:     73.7   %    71 - 87
FL:       44.5  mm     G. Age:  24w 5d         64  %    FL/AC:      22.3   %    20 - 24
Est. FW:     708  gm      1 lb 9 oz     65  %
Gestational Age

LMP:           22w 4d        Date:  03/22/17                 EDD:   12/27/17
U/S Today:     24w 3d                                        EDD:   12/14/17
Best:          23w 6d     Det. By:  U/S  (06/25/17)          EDD:   12/18/17
Anatomy

Cranium:               Appears normal         Aortic Arch:            Appears normal
Cavum:                 Appears normal         Ductal Arch:            Previously seen
Ventricles:            Previously seen        Diaphragm:              Appears normal
Choroid Plexus:        Previously seen        Stomach:                Appears normal, left
sided
Cerebellum:            Previously seen        Abdomen:                Appears normal
Posterior Fossa:       Previously seen        Abdominal Wall:         Previously seen
Nuchal Fold:           Previously seen        Cord Vessels:           Previously seen
Face:                  Orbits and profile     Kidneys:                Appear normal
previously seen
Lips:                  Previously seen        Bladder:                Appears normal
Thoracic:              Appears normal         Spine:                  Previously seen
Heart:                 Appears normal         Upper Extremities:      Previously seen
(4CH, axis, and
situs)
RVOT:                  Appears normal         Lower Extremities:      Previously seen
LVOT:                  Appears normal

Other:  Fetus appears to be a male.Heels previously seen.. Technically
difficult due to fetal position.
Cervix Uterus Adnexa

Cervix
Length:           3.36  cm.
Normal appearance by transabdominal scan.

Uterus
No abnormality visualized.

Left Ovary
Not visualized.

Right Ovary
Not visualized.

Adnexa:       No abnormality visualized.
Impression

Single living intrauterine pregnancy at 23 weeks 6 days.
Appropriate fetal growth (65%).
Normal amniotic fluid volume.
The fetal anatomic survey is complete.
Normal fetal anatomy.
No fetal anomalies or soft markers of aneuploidy seen.
Recommendations

Follow-up ultrasounds as clinically indicated.

## 2022-12-26 ENCOUNTER — Other Ambulatory Visit: Payer: Self-pay

## 2022-12-26 ENCOUNTER — Ambulatory Visit (INDEPENDENT_AMBULATORY_CARE_PROVIDER_SITE_OTHER): Payer: Medicaid Other

## 2022-12-26 VITALS — BP 99/60 | HR 68 | Ht 60.0 in | Wt 104.1 lb

## 2022-12-26 DIAGNOSIS — Z3201 Encounter for pregnancy test, result positive: Secondary | ICD-10-CM

## 2022-12-26 NOTE — Progress Notes (Signed)
Pt here today for pregnancy test resulting positive.  Pt reports LMP 09/16/22, EDD 06/23/23, and 14w 3d.   FHR 143. Pt voiced that she did not have insurance at the time so she was waiting to get it before starting care.  Medication and allergies reviewed.  Pt advised when to go to [facility] for evaluation.  Pt states that she lives in [place].  I advised pt that she could get established at our [facility] office as they are closer to her.  Pt agreed that she would like to go to [facility] as they are closer.  Front office notified. Pt provided with medications that are safe to take in pregnancy via AVS.  Pt verbalized understanding.   Leonette Nutting  12/26/22

## 2022-12-26 NOTE — Patient Instructions (Signed)

## 2022-12-27 LAB — POCT PREGNANCY, URINE: Preg Test, Ur: POSITIVE — AB

## 2023-01-08 ENCOUNTER — Other Ambulatory Visit (INDEPENDENT_AMBULATORY_CARE_PROVIDER_SITE_OTHER): Payer: Medicaid Other

## 2023-01-08 ENCOUNTER — Encounter: Payer: Self-pay | Admitting: Family Medicine

## 2023-01-08 ENCOUNTER — Other Ambulatory Visit (HOSPITAL_COMMUNITY)
Admission: RE | Admit: 2023-01-08 | Discharge: 2023-01-08 | Disposition: A | Discharge status: Home / Self Care | Payer: Medicaid Other | Source: Ambulatory Visit | Attending: Family Medicine | Admitting: Family Medicine

## 2023-01-08 ENCOUNTER — Ambulatory Visit (INDEPENDENT_AMBULATORY_CARE_PROVIDER_SITE_OTHER): Payer: Medicaid Other | Admitting: Family Medicine

## 2023-01-08 VITALS — BP 94/61 | HR 79 | Wt 108.8 lb

## 2023-01-08 DIAGNOSIS — Z3201 Encounter for pregnancy test, result positive: Secondary | ICD-10-CM | POA: Diagnosis not present

## 2023-01-08 DIAGNOSIS — Z3482 Encounter for supervision of other normal pregnancy, second trimester: Secondary | ICD-10-CM

## 2023-01-08 DIAGNOSIS — Z3A16 16 weeks gestation of pregnancy: Secondary | ICD-10-CM

## 2023-01-08 DIAGNOSIS — Z3480 Encounter for supervision of other normal pregnancy, unspecified trimester: Secondary | ICD-10-CM | POA: Insufficient documentation

## 2023-01-08 DIAGNOSIS — Z789 Other specified health status: Secondary | ICD-10-CM | POA: Diagnosis not present

## 2023-01-08 NOTE — Progress Notes (Signed)
ROB: Some Right sided groin pain   Pt stating that periods were regular every 28 days.

## 2023-01-08 NOTE — Progress Notes (Signed)
Subjective:   Castro Castro is a 27 y.o. G4P2011 at [redacted]w[redacted]d by LMP, midtrimester ultrasound being seen today for her first obstetrical visit.  Her obstetrical history is not significant. Patient does intend to breast feed. Pregnancy history fully reviewed.  Patient reports no complaints.  HISTORY: OB History  Gravida Para Term Preterm AB Living  4 2 2  0 1 1  SAB IAB Ectopic Multiple Live Births  0 1 0 0 2    # Outcome Date GA Lbr Len/2nd Weight Sex Type Anes PTL Lv  4 Current           3 Term 02/04/21 [redacted]w[redacted]d  6 lb 7 oz (2.92 kg)  Vag-Spont     2 Term 12/26/17 [redacted]w[redacted]d 16:16 / 02:16 8 lb 6.2 oz (3.805 kg) M Vag-Spont EPI  LIV     Name: Castro Castro     Apgar1: 7  Apgar5: 9  1 IAB            Last pap smear was  2018 and was normal Past Medical History:  Diagnosis Date   Bacterial vaginosis    GERD (gastroesophageal reflux disease)    UTI (urinary tract infection)    Past Surgical History:  Procedure Laterality Date   NO PAST SURGERIES     Family History  Problem Relation Age of Onset   Cancer Maternal Grandmother    Breast cancer Maternal Grandmother 50   Social History   Tobacco Use   Smoking status: Never   Smokeless tobacco: Never  Vaping Use   Vaping status: Never Used  Substance Use Topics   Alcohol use: Yes    Alcohol/week: 1.0 standard drink of alcohol    Types: 1 Standard drinks or equivalent per week    Comment: Occas. prior to preg.   Drug use: No   No Known Allergies Current Outpatient Medications on File Prior to Visit  Medication Sig Dispense Refill   Prenatal Vit-Fe Gluconate-FA (OBEGYN LIQUID PRENATAL VITAMIN PO) Take by mouth.     No current facility-administered medications on file prior to visit.     Exam   Vitals:   01/08/23 0835  BP: 94/61  Pulse: 79  Weight: 108 lb 12.8 oz (49.4 kg)   Fetal Heart Rate (bpm): 149  Uterus:   16 weeks size  Pelvic Exam: Perineum: no hemorrhoids, normal perineum   Vulva: normal external  genitalia, no lesions   Vagina:  normal mucosa, normal discharge   Cervix: no lesions and normal, pap smear done.    Adnexa: normal adnexa and no mass, fullness, tenderness   Bony Pelvis: average  System: General: well-developed, well-nourished female in no acute distress   Breast:  normal appearance, no masses or tenderness   Skin: normal coloration and turgor, no rashes   Neurologic: oriented, normal, negative, normal mood   Extremities: normal strength, tone, and muscle mass, ROM of all joints is normal   HEENT extraocular movement intact and sclera clear, anicteric   Mouth/Teeth mucous membranes moist, pharynx normal without lesions and dental hygiene good   Neck supple and no masses   Cardiovascular: regular rate and rhythm   Respiratory:  no respiratory distress, normal breath sounds   Abdomen: soft, non-tender; bowel sounds normal; no masses,  no organomegaly   U/s is limited and is c/w dates   Assessment:   Pregnancy: [id number] Patient Active Problem List   Diagnosis Date Noted   Supervision of other normal pregnancy, antepartum 06/02/2017  Plan:  1. Positive pregnancy test - US OB Limited; Future  2. Supervision of other normal pregnancy, antepartum New OB labs - Culture, OB Urine - CBC/D/Plt+RPR+Rh+ABO+RubIgG... - AFP, Serum, Open Spina Bifida - Cytology - PAP - HORIZON Basic Panel - PANORAMA PRENATAL TEST - Enroll patient in [employer] Program - [place] MFM OB COMP + 14 WK; Future  3. [redacted] weeks gestation of pregnancy   4. Vegan diet Check B12 and replete if necessary - B12 and Folate Panel   Initial labs drawn. Continue prenatal vitamins. Genetic Screening discussed, NIPS: requested. Ultrasound discussed; fetal anatomic survey: ordered. Problem list reviewed and updated.  Return in 4 weeks (on 02/05/2023).

## 2023-01-08 NOTE — Progress Notes (Signed)
Patient informed that the ultrasound is considered a limited obstetric ultrasound and is not intended to be a complete ultrasound exam.  Patient also informed that the ultrasound is not being completed with the intent of assessing for fetal or placental anomalies or any pelvic abnormalities. Explained that the purpose of today's ultrasound is to assess for fetal heart rate.  Patient acknowledges the purpose of the exam and the limitations of the study.     Christine Soliz, RN     

## 2023-01-10 LAB — CYTOLOGY - PAP: Comment: NORMAL

## 2023-02-05 ENCOUNTER — Telehealth: Payer: Self-pay

## 2023-02-05 ENCOUNTER — Encounter: Payer: Medicaid Other | Admitting: Family Medicine

## 2023-02-05 ENCOUNTER — Ambulatory Visit: Payer: Medicaid Other

## 2023-02-05 ENCOUNTER — Encounter: Payer: Self-pay | Admitting: Family Medicine

## 2023-02-05 NOTE — Telephone Encounter (Signed)
Pt called in to check on appt day and time for today. Notified pt of MFM appt at 2:30 pm and 4:10 pm. Pt disconnected call.

## 2023-02-06 NOTE — Progress Notes (Signed)
Patient did not keep appointment today. She will be called to reschedule.  

## 2023-03-05 ENCOUNTER — Encounter: Payer: Self-pay | Admitting: Family Medicine

## 2023-03-05 ENCOUNTER — Encounter: Payer: Medicaid Other | Admitting: Family Medicine

## 2023-03-05 NOTE — Progress Notes (Signed)
Patient did not keep appointment today. She will be called to reschedule.

## 2023-03-10 ENCOUNTER — Ambulatory Visit: Payer: Medicaid Other

## 2023-03-18 ENCOUNTER — Telehealth: Payer: Self-pay

## 2023-03-18 NOTE — Telephone Encounter (Signed)
Left message for pt to call office beck regarding upcoming and missed appointments.
